# Patient Record
Sex: Female | Born: 1950 | Race: White | Hispanic: No | Smoking: Never smoker
Health system: Southern US, Community
[De-identification: ages and names within clinical notes are randomized; demographics above are authoritative.]

## PROBLEM LIST (undated history)

## (undated) DIAGNOSIS — D510 Vitamin B12 deficiency anemia due to intrinsic factor deficiency: Secondary | ICD-10-CM

## (undated) DIAGNOSIS — D509 Iron deficiency anemia, unspecified: Secondary | ICD-10-CM

## (undated) DIAGNOSIS — Z9289 Personal history of other medical treatment: Secondary | ICD-10-CM

## (undated) DIAGNOSIS — F4321 Adjustment disorder with depressed mood: Secondary | ICD-10-CM

## (undated) DIAGNOSIS — I10 Essential (primary) hypertension: Secondary | ICD-10-CM

## (undated) DIAGNOSIS — D61818 Other pancytopenia: Secondary | ICD-10-CM

## (undated) DIAGNOSIS — E039 Hypothyroidism, unspecified: Secondary | ICD-10-CM

## (undated) HISTORY — PX: ABDOMINAL HYSTERECTOMY: SHX81

## (undated) HISTORY — DX: Vitamin B12 deficiency anemia due to intrinsic factor deficiency: D51.0

## (undated) HISTORY — PX: EXPLORATORY LAPAROTOMY: SUR591

## (undated) HISTORY — DX: Hypothyroidism, unspecified: E03.9

---

## 2001-03-17 ENCOUNTER — Encounter: Payer: Self-pay | Admitting: Family Medicine

## 2001-03-17 ENCOUNTER — Encounter: Admission: RE | Admit: 2001-03-17 | Discharge: 2001-03-17 | Payer: Self-pay | Admitting: Family Medicine

## 2004-07-01 ENCOUNTER — Encounter: Admission: RE | Admit: 2004-07-01 | Discharge: 2004-07-01 | Payer: Self-pay | Admitting: Family Medicine

## 2005-05-11 ENCOUNTER — Emergency Department (HOSPITAL_COMMUNITY): Admission: EM | Admit: 2005-05-11 | Discharge: 2005-05-11 | Payer: Self-pay | Admitting: *Deleted

## 2006-02-25 ENCOUNTER — Encounter: Admission: RE | Admit: 2006-02-25 | Discharge: 2006-02-25 | Payer: Self-pay | Admitting: Family Medicine

## 2006-03-10 ENCOUNTER — Encounter: Admission: RE | Admit: 2006-03-10 | Discharge: 2006-03-10 | Payer: Self-pay | Admitting: Family Medicine

## 2006-11-14 ENCOUNTER — Emergency Department (HOSPITAL_COMMUNITY): Admission: EM | Admit: 2006-11-14 | Discharge: 2006-11-14 | Payer: Self-pay | Admitting: Emergency Medicine

## 2010-11-27 ENCOUNTER — Other Ambulatory Visit: Payer: Self-pay | Admitting: Family Medicine

## 2010-11-27 DIAGNOSIS — Z1231 Encounter for screening mammogram for malignant neoplasm of breast: Secondary | ICD-10-CM

## 2010-12-23 ENCOUNTER — Ambulatory Visit
Admission: RE | Admit: 2010-12-23 | Discharge: 2010-12-23 | Disposition: A | Discharge status: Home / Self Care | Payer: BC Managed Care – PPO | Source: Ambulatory Visit | Attending: Family Medicine | Admitting: Family Medicine

## 2010-12-23 DIAGNOSIS — Z1231 Encounter for screening mammogram for malignant neoplasm of breast: Secondary | ICD-10-CM

## 2012-06-24 ENCOUNTER — Other Ambulatory Visit: Payer: Self-pay | Admitting: Family Medicine

## 2012-06-24 DIAGNOSIS — Z1231 Encounter for screening mammogram for malignant neoplasm of breast: Secondary | ICD-10-CM

## 2012-06-30 ENCOUNTER — Ambulatory Visit
Admission: RE | Admit: 2012-06-30 | Discharge: 2012-06-30 | Disposition: A | Discharge status: Home / Self Care | Payer: Self-pay | Source: Ambulatory Visit | Attending: Family Medicine | Admitting: Family Medicine

## 2012-06-30 DIAGNOSIS — Z1231 Encounter for screening mammogram for malignant neoplasm of breast: Secondary | ICD-10-CM

## 2013-09-29 ENCOUNTER — Emergency Department (HOSPITAL_COMMUNITY): Payer: BC Managed Care – PPO

## 2013-09-29 ENCOUNTER — Emergency Department (HOSPITAL_COMMUNITY)
Admission: EM | Admit: 2013-09-29 | Discharge: 2013-09-29 | Disposition: A | Discharge status: Home / Self Care | Payer: BC Managed Care – PPO | Attending: Emergency Medicine | Admitting: Emergency Medicine

## 2013-09-29 ENCOUNTER — Encounter (HOSPITAL_COMMUNITY): Payer: Self-pay | Admitting: Emergency Medicine

## 2013-09-29 DIAGNOSIS — Z90710 Acquired absence of both cervix and uterus: Secondary | ICD-10-CM | POA: Insufficient documentation

## 2013-09-29 DIAGNOSIS — E876 Hypokalemia: Secondary | ICD-10-CM

## 2013-09-29 DIAGNOSIS — I10 Essential (primary) hypertension: Secondary | ICD-10-CM | POA: Insufficient documentation

## 2013-09-29 DIAGNOSIS — R630 Anorexia: Secondary | ICD-10-CM | POA: Insufficient documentation

## 2013-09-29 DIAGNOSIS — E079 Disorder of thyroid, unspecified: Secondary | ICD-10-CM | POA: Insufficient documentation

## 2013-09-29 DIAGNOSIS — Z79899 Other long term (current) drug therapy: Secondary | ICD-10-CM | POA: Insufficient documentation

## 2013-09-29 DIAGNOSIS — R109 Unspecified abdominal pain: Secondary | ICD-10-CM

## 2013-09-29 DIAGNOSIS — K8020 Calculus of gallbladder without cholecystitis without obstruction: Secondary | ICD-10-CM | POA: Insufficient documentation

## 2013-09-29 HISTORY — DX: Essential (primary) hypertension: I10

## 2013-09-29 LAB — COMPREHENSIVE METABOLIC PANEL
Albumin: 4.3 g/dL (ref 3.5–5.2)
Alkaline Phosphatase: 83 U/L (ref 39–117)
BUN: 10 mg/dL (ref 6–23)
Creatinine, Ser: 0.67 mg/dL (ref 0.50–1.10)
GFR calc Af Amer: >90 mL/min (ref >90)
Glucose, Bld: 120 mg/dL — ABNORMAL HIGH (ref 70–99)
Potassium: 2.8 mEq/L — ABNORMAL LOW (ref 3.5–5.1)
Total Protein: 8 g/dL (ref 6.0–8.3)

## 2013-09-29 LAB — CBC WITH DIFFERENTIAL/PLATELET
Basophils Absolute: 0 10*3/uL (ref 0.0–0.1)
Basophils Relative: 0 % (ref 0–1)
Eosinophils Absolute: 0 10*3/uL (ref 0.0–0.7)
Eosinophils Relative: 0 % (ref 0–5)
HCT: 38.4 % (ref 36.0–46.0)
Hemoglobin: 14.2 g/dL (ref 12.0–15.0)
MCH: 30.7 pg (ref 26.0–34.0)
MCHC: 37 g/dL — ABNORMAL HIGH (ref 30.0–36.0)
MCV: 83.1 fL (ref 78.0–100.0)
Monocytes Absolute: 0.5 10*3/uL (ref 0.1–1.0)
Monocytes Relative: 7 % (ref 3–12)
Neutro Abs: 6 10*3/uL (ref 1.7–7.7)
RDW: 12.8 % (ref 11.5–15.5)

## 2013-09-29 LAB — URINALYSIS, ROUTINE W REFLEX MICROSCOPIC
Glucose, UA: 100 mg/dL — AB
Ketones, ur: >80 mg/dL — AB
Leukocytes, UA: NEGATIVE
Nitrite: NEGATIVE
Protein, ur: NEGATIVE mg/dL
pH: 8 (ref 5.0–8.0)

## 2013-09-29 MED ORDER — SODIUM CHLORIDE 0.9 % IV BOLUS (SEPSIS)
500.0000 mL | Freq: Once | INTRAVENOUS | Status: AC
  Administered 2013-09-29: 500 mL via INTRAVENOUS

## 2013-09-29 MED ORDER — ONDANSETRON HCL 4 MG/2ML IJ SOLN
4.0000 mg | Freq: Once | INTRAMUSCULAR | Status: AC
  Administered 2013-09-29: 4 mg via INTRAVENOUS
  Filled 2013-09-29: qty 2

## 2013-09-29 MED ORDER — POTASSIUM CHLORIDE CRYS ER 20 MEQ PO TBCR
40.0000 meq | EXTENDED_RELEASE_TABLET | Freq: Once | ORAL | Status: AC
  Administered 2013-09-29: 40 meq via ORAL
  Filled 2013-09-29: qty 2

## 2013-09-29 MED ORDER — MORPHINE SULFATE 4 MG/ML IJ SOLN
4.0000 mg | Freq: Once | INTRAMUSCULAR | Status: AC
  Administered 2013-09-29: 4 mg via INTRAVENOUS
  Filled 2013-09-29: qty 1

## 2013-09-29 MED ORDER — ONDANSETRON 8 MG PO TBDP: 8.0000 mg | ORAL_TABLET | Freq: Once | ORAL | Status: DC

## 2013-09-29 MED ORDER — OXYCODONE-ACETAMINOPHEN 5-325 MG PO TABS: 1.0000 | ORAL_TABLET | Freq: Four times a day (QID) | ORAL | Status: DC | PRN

## 2013-09-29 MED ORDER — ONDANSETRON 4 MG PO TBDP: 4.0000 mg | ORAL_TABLET | Freq: Three times a day (TID) | ORAL | Status: DC | PRN

## 2013-09-29 NOTE — ED Notes (Signed)
Pt c/o n/v/d and abd pain that started earlier today. Pt vomited approx 3-4 times. Been to bathroom apporx 5 times with only 1 watery stool.

## 2013-09-29 NOTE — ED Provider Notes (Signed)
CSN: [id number]     Arrival date & time 09/29/13  1533 History   First MD Initiated Contact with Patient 09/29/13 1613     Chief Complaint  Patient presents with  . Abdominal Pain  . Emesis  . Diarrhea   (Consider location/radiation/quality/duration/timing/severity/associated sxs/prior Treatment) HPI Comments: Patient with h/o hysterectomy -- presents with acute onset upper abdominal pain at 11am with lower abdominal cramping, vomiting, soft/watery stools. She has had similar, less severe episodes x 5 over the past 6 weeks that resolve after several hours. She has residual anorexia afterwards. No fever, URI symptoms. No CP/SOB but pain can radiate into L breast at times. No urinary symptoms, LE edema, or skin swelling. Denies heavy NSAID use, denies alcohol use. The onset of this condition was acute. The course is constant. Aggravating factors: none. Alleviating factors: none.    The history is provided by the patient.    Past Medical History  Diagnosis Date  . Hypertension   . Thyroid disease    Past Surgical History  Procedure Laterality Date  . Abdominal hysterectomy     No family history on file. History  Substance Use Topics  . Smoking status: Never Smoker   . Smokeless tobacco: Never Used  . Alcohol Use: No   OB History   Grav Para Term Preterm Abortions TAB SAB Ect Mult Living                 Review of Systems  Constitutional: Negative for fever.  HENT: Negative for rhinorrhea and sore throat.   Eyes: Negative for redness.  Respiratory: Negative for cough.   Cardiovascular: Negative for chest pain.  Gastrointestinal: Positive for nausea, vomiting, abdominal pain and diarrhea. Negative for blood in stool.  Genitourinary: Negative for dysuria.  Musculoskeletal: Negative for myalgias.  Skin: Negative for rash.  Neurological: Negative for headaches.    Allergies  Review of patient's allergies indicates no known allergies.  Home Medications   Current  Outpatient Rx  Name  Route  Sig  Dispense  Refill  . cholecalciferol (VITAMIN D) 1000 UNITS tablet   Oral   Take 1,000 Units by mouth daily.         . hydrochlorothiazide (HYDRODIURIL) 25 MG tablet   Oral   Take 25 mg by mouth daily.         Marland Kitchen levothyroxine (SYNTHROID, LEVOTHROID) 125 MCG tablet   Oral   Take 125 mcg by mouth daily before breakfast.          BP 145/93  Pulse 59  Temp(Src) 98.4 F (36.9 C) (Oral)  Resp 20  SpO2 100% Physical Exam  Nursing note and vitals reviewed. Constitutional: She appears well-developed and well-nourished.  HENT:  Head: Normocephalic and atraumatic.  Eyes: Conjunctivae are normal. Right eye exhibits no discharge. Left eye exhibits no discharge.  Neck: Normal range of motion. Neck supple.  Cardiovascular: Normal rate, regular rhythm and normal heart sounds.   Pulmonary/Chest: Effort normal and breath sounds normal.  Abdominal: Soft. There is tenderness in the right upper quadrant, epigastric area, periumbilical area and left upper quadrant. There is no rebound, no guarding, no tenderness at McBurney's point and negative Murphy's sign.  Neurological: She is alert.  Skin: Skin is warm and dry.  Psychiatric: She has a normal mood and affect.    ED Course  Procedures (including critical care time) Labs Review Labs Reviewed  CBC WITH DIFFERENTIAL - Abnormal; Notable for the following:    MCHC 37.0 (*)  Neutrophils Relative % 78 (*)    All other components within normal limits  LIPASE, BLOOD - Abnormal; Notable for the following:    Lipase 70 (*)    All other components within normal limits  COMPREHENSIVE METABOLIC PANEL - Abnormal; Notable for the following:    Sodium 131 (*)    Potassium 2.8 (*)    Chloride 92 (*)    Glucose, Bld 120 (*)    All other components within normal limits  URINALYSIS, ROUTINE W REFLEX MICROSCOPIC   Imaging Review No results found.  EKG Interpretation    Date/Time:  Friday September 29 2013  18:42:19 EST Ventricular Rate:  59 PR Interval:  141 QRS Duration: 100 QT Interval:  462 QTC Calculation: 458 R Axis:   -31 Text Interpretation:  Sinus rhythm Left axis deviation No previous tracing Confirmed by POLLINA  MD, CHRISTOPHER ([id number]) on 09/29/2013 6:48:26 PM           4:28 PM Patient seen and examined. Work-up initiated. Medications ordered.   6:03 PM Pt updated. Imaging reviewed by myself. Will order [place] to eval gallbladder. Patient's pain controlled but slowly returning. She would like additional pain medication. Ordered.   Findings discussed with Dr. Blinda Leatherwood.   Pt informed. She is feeling much better. Pain is 1/10. We discussed cholelithiasis and no indications for admission now.   Will d/c to home with PCP/surgery follow-up as symptoms are well controlled.   The patient was urged to return to the Emergency Department immediately with worsening of current symptoms, worsening abdominal pain, persistent vomiting, blood noted in stools, fever, or any other concerns. The patient verbalized understanding.   Patient counseled on use of narcotic pain medications. Counseled not to combine these medications with others containing tylenol. Urged not to drink alcohol, drive, or perform any other activities that requires focus while taking these medications. The patient verbalizes understanding and agrees with the plan.  Patient notified of low potassium and asked to follow this with PCP and supplement potassium with OTC supplements. Patient verbalizes understanding and agrees with plan.     MDM   1. Abdominal pain   2. Cholelithiasis   3. Hypokalemia    Abdominal pain: likely upper GI etiology. Suspect cholelithiasis given findings today but PUD, esophageal spasm could be possible. Doubt pancreatitis. No signs of biliary obstruction. Will need PCP/surgery eval.   Cholelithiasis: No signs of cholecystitis  Low potassium/sodium/chloride: pt asymptomatic, likely 2/2 HCTZ  use. Pt informed. She will supplement and follow with PCP. Fluid bolus given in ED.    Renne Crigler, PA-C 09/29/13 2140

## 2013-09-29 NOTE — Progress Notes (Signed)
Patient confirms her pcp is Dr. Gerlene Fee.  System updated.

## 2013-09-29 NOTE — ED Notes (Signed)
Ultrasound at bedside

## 2013-09-30 NOTE — ED Provider Notes (Signed)
Medical screening examination/treatment/procedure(s) were performed by non-physician practitioner and as supervising physician I was immediately available for consultation/collaboration.  EKG Interpretation   None         Kebin Maye J. Lem Peary, MD 09/30/13 1514 

## 2013-10-17 ENCOUNTER — Encounter (INDEPENDENT_AMBULATORY_CARE_PROVIDER_SITE_OTHER): Payer: Self-pay | Admitting: General Surgery

## 2013-10-17 ENCOUNTER — Telehealth (INDEPENDENT_AMBULATORY_CARE_PROVIDER_SITE_OTHER): Payer: Self-pay | Admitting: General Surgery

## 2013-10-17 ENCOUNTER — Ambulatory Visit (INDEPENDENT_AMBULATORY_CARE_PROVIDER_SITE_OTHER): Payer: BC Managed Care – PPO | Admitting: General Surgery

## 2013-10-17 VITALS — BP 110/80 | HR 66 | Temp 97.0°F | Resp 12 | Ht 60.0 in | Wt 150.0 lb

## 2013-10-17 DIAGNOSIS — E876 Hypokalemia: Secondary | ICD-10-CM

## 2013-10-17 DIAGNOSIS — E079 Disorder of thyroid, unspecified: Secondary | ICD-10-CM

## 2013-10-17 DIAGNOSIS — I10 Essential (primary) hypertension: Secondary | ICD-10-CM | POA: Insufficient documentation

## 2013-10-17 DIAGNOSIS — K8020 Calculus of gallbladder without cholecystitis without obstruction: Secondary | ICD-10-CM

## 2013-10-17 NOTE — Telephone Encounter (Signed)
Patient met with surgery scheduling went over financial responsibility, patient will call back schedule

## 2013-10-17 NOTE — Patient Instructions (Signed)
The episodes of abdominal pain that you have been having over the past 6 weeks sounds very much like gallbladder attacks.  Your ultrasound confirmed that you have gallstones, and so it is almost certain that your gallbladder is causing this. This will continue , intermittently, until something is done.  To date, you have not had any serious complications.  You will be scheduled electively for laparoscopic cholecystectomy with cholangiogram, possible open.       Laparoscopic Cholecystectomy Laparoscopic cholecystectomy is surgery to remove the gallbladder. The gallbladder is located slightly to the right of center in the abdomen, behind the liver. It is a concentrating and storage sac for the bile produced in the liver. Bile aids in the digestion and absorption of fats. Gallbladder disease (cholecystitis) is an inflammation of your gallbladder. This condition is usually caused by a buildup of gallstones (cholelithiasis) in your gallbladder. Gallstones can block the flow of bile, resulting in inflammation and pain. In severe cases, emergency surgery may be required. When emergency surgery is not required, you will have time to prepare for the procedure. Laparoscopic surgery is an alternative to open surgery. Laparoscopic surgery usually has a shorter recovery time. Your common bile duct may also need to be examined and explored. Your caregiver will discuss this with you if he or she feels this should be done. If stones are found in the common bile duct, they may be removed. LET YOUR CAREGIVER KNOW ABOUT:  Allergies to food or medicine.  Medicines taken, including vitamins, herbs, eyedrops, over-the-counter medicines, and creams.  Use of steroids (by mouth or creams).  Previous problems with anesthetics or numbing medicines.  History of bleeding problems or blood clots.  Previous surgery.  Other health problems, including diabetes and kidney problems.  Possibility of pregnancy, if this  applies. RISKS AND COMPLICATIONS All surgery is associated with risks. Some problems that may occur following this procedure include:  Infection.  Damage to the common bile duct, nerves, arteries, veins, or other internal organs such as the stomach or intestines.  Bleeding.  A stone may remain in the common bile duct. BEFORE THE PROCEDURE  Do not take aspirin for 3 days prior to surgery or blood thinners for 1 week prior to surgery.  Do not eat or drink anything after midnight the night before surgery.  Let your caregiver know if you develop a cold or other infectious problem prior to surgery.  You should be present 60 minutes before the procedure or as directed. PROCEDURE  You will be given medicine that makes you sleep (general anesthetic). When you are asleep, your surgeon will make several small cuts (incisions) in your abdomen. One of these incisions is used to insert a small, lighted scope (laparoscope) into the abdomen. The laparoscope helps the surgeon see into your abdomen. Carbon dioxide gas will be pumped into your abdomen. The gas allows more room for the surgeon to perform your surgery. Other operating instruments are inserted through the other incisions. Laparoscopic procedures may not be appropriate when:  There is major scarring from previous surgery.  The gallbladder is extremely inflamed.  There are bleeding disorders or unexpected cirrhosis of the liver.  A pregnancy is near term.  Other conditions make the laparoscopic procedure impossible. If your surgeon feels it is not safe to continue with a laparoscopic procedure, he or she will perform an open abdominal procedure. In this case, the surgeon will make an incision to open the abdomen. This gives the surgeon a larger view and  field to work within. This may allow the surgeon to perform procedures that sometimes cannot be performed with a laparoscope alone. Open surgery has a longer recovery time. AFTER THE  PROCEDURE  You will be taken to the recovery area where a nurse will watch and check your progress.  You may be allowed to go home the same day.  Do not resume physical activities until directed by your caregiver.  You may resume a normal diet and activities as directed. Document Released: 10/05/2005 Document Revised: 12/28/2011 Document Reviewed: 05/17/2013 [facility] Patient Information 2014 [place], [place].

## 2013-10-17 NOTE — Telephone Encounter (Deleted)
Patient met with surgery scheduling went over financial responsibility, patient will call back schedule °

## 2013-10-17 NOTE — Progress Notes (Signed)
Patient ID: Alexis Ochoa, female   DOB: 1950-12-20, 62 y.o.   MRN: [id number]  Chief Complaint  Patient presents with  . Abdominal Pain    Gallstone    HPI Alexis Ochoa is a 62 y.o. female.  She is referred by Dr. Jaci Carrel at  the [facility] for evaluation and management of symptomatic gallstones. Dr. Jeanmarie Hubert is her PCP.   The patient is a 6 week history of intermittent attacks of mild upper abdominal discomfort that resolved after cervical hours. Anorexia last much longer period of fever cough. She's had a few episodes of more severe sharp upper abdominal pain, also with some lower abdominal cramping and occasional soft her stools. She had a severe attack on December 12. This was associated with vomiting. She went to [place] on where she department. The pain resolved after a few hours. Ultrasound showed a 1.9 cm gallstone. CBC was normal. Lipase 70. Potassium 2.8. Glucose 120. Normal urinalysis. She has been very careful about what she eats and has not had any severe problems since then.  Comorbidities include hypertension and hypothyroidism on prescription medication. She's had an abdominal hysterectomy but her ovaries are retained.  Diagnostic laparoscopy earlier in her life and was told she had endometriosis. This was done through an infraumbilical incision. Last colonoscopy 2012, normal, [facility]. She denies liver disease, cardiovascular disease, moderate disease.  History reveals that 2 sisters and her mother have had their gallbladders removed. Abdominal Pain Associated symptoms: nausea and vomiting   Associated symptoms: no chest pain, no chills, no constipation, no cough, no diarrhea, no fever, no hematuria, no sore throat and no vaginal bleeding     Past Medical History  Diagnosis Date  . Hypertension   . Thyroid disease     Past Surgical History  Procedure Laterality Date  . Abdominal hysterectomy    . Exploratory laparotomy      No family history on  file.  Social History History  Substance Use Topics  . Smoking status: Never Smoker   . Smokeless tobacco: Never Used  . Alcohol Use: No    No Known Allergies  Current Outpatient Prescriptions  Medication Sig Dispense Refill  . cholecalciferol (VITAMIN D) 1000 UNITS tablet Take 1,000 Units by mouth daily.      . hydrochlorothiazide (HYDRODIURIL) 25 MG tablet Take 25 mg by mouth daily.      Marland Kitchen levothyroxine (SYNTHROID, LEVOTHROID) 125 MCG tablet Take 125 mcg by mouth daily before breakfast.       No current facility-administered medications for this visit.    Review of Systems Review of Systems  Constitutional: Negative for fever, chills and unexpected weight change.  HENT: Negative for congestion, hearing loss, sore throat, trouble swallowing and voice change.   Eyes: Negative for visual disturbance.  Respiratory: Negative for cough and wheezing.   Cardiovascular: Negative for chest pain, palpitations and leg swelling.  Gastrointestinal: Positive for nausea, vomiting and abdominal pain. Negative for diarrhea, constipation, blood in stool, abdominal distention and anal bleeding.  Genitourinary: Negative for hematuria, vaginal bleeding and difficulty urinating.  Musculoskeletal: Negative for arthralgias.  Skin: Negative for rash and wound.  Neurological: Negative for seizures, syncope and headaches.  Hematological: Negative for adenopathy. Does not bruise/bleed easily.  Psychiatric/Behavioral: Negative for confusion.    Blood pressure 110/80, pulse 66, temperature 97 F (36.1 C), temperature source Oral, resp. rate 12, height 5' (1.524 m), weight 150 lb (68.04 kg).  Physical Exam Physical Exam  Constitutional: She is oriented  to person, place, and time. She appears well-developed and well-nourished. No distress.  HENT:  Head: Normocephalic and atraumatic.  Nose: Nose normal.  Mouth/Throat: No oropharyngeal exudate.  Eyes: Conjunctivae and EOM are normal. Pupils are equal,  round, and reactive to light. Left eye exhibits no discharge. No scleral icterus.  Neck: Neck supple. No JVD present. No tracheal deviation present. No thyromegaly present.  Cardiovascular: Normal rate, regular rhythm, normal heart sounds and intact distal pulses.   No murmur heard. Pulmonary/Chest: Effort normal and breath sounds normal. No respiratory distress. She has no wheezes. She has no rales. She exhibits no tenderness.  Abdominal: Soft. Bowel sounds are normal. She exhibits no distension and no mass. There is no tenderness. There is no rebound and no guarding.  Short vertical incision at the lower rim of the umbilicus, well healed. Pfannenstiel incision well healed. Examination otherwise benign.  Musculoskeletal: She exhibits no edema and no tenderness.  Lymphadenopathy:    She has no cervical adenopathy.  Neurological: She is alert and oriented to person, place, and time. She exhibits normal muscle tone. Coordination normal.  Skin: Skin is warm. No rash noted. She is not diaphoretic. No erythema. No pallor.  Psychiatric: She has a normal mood and affect. Her behavior is normal. Judgment and thought content normal.    Data Reviewed Emergency department records. Lab and x-ray.  Assessment    Chronic cholecystitis with cholelithiasis. Historically this sounds like she is having repeated episodes of severe biliary colic superimposed on a background of mild episodes of biliary colic. No complications today  Hypertension  Hypothyroidism  Status post abdominal hysterectomy with retained ovaries  Status post laparoscopy for endometriosis  Up-to-date on colonoscopy  Strong family history for cholecystectomy     Plan    We had a long discussion, and she was advised that her symptoms were likely to continue until she has a cholecystectomy. She is in favor of having elective cholecystectomy.  I discussed the indications, details, techniques, and numerous risk of cholecystectomy  with her and her husband. She is aware of the risk of bleeding, infection, incisional hernia, conversion to open laparotomy, bile leak with readmission for treatment, injury to adjacent organs such as intestine or bile duct with major reconstructive surgery, postop diarrhea, and other unforeseen problems. She understands all these issues well. This time all questions are answered. She is in full agreement with this plan.  We will schedule for laparoscopic cholecystectomy with cholangiogram, possible open in the near future her convenience.        Angelia Mould. Derrell Lolling, M.D., [facility] General and Minimally invasive Surgery Breast and Colorectal Surgery Office:   [phone] Pager:   [phone]  10/17/2013, 3:23 PM

## 2013-11-19 HISTORY — PX: LAPAROSCOPIC CHOLECYSTECTOMY: SUR755

## 2013-11-22 ENCOUNTER — Other Ambulatory Visit (INDEPENDENT_AMBULATORY_CARE_PROVIDER_SITE_OTHER): Payer: Self-pay | Admitting: General Surgery

## 2013-11-22 ENCOUNTER — Other Ambulatory Visit (INDEPENDENT_AMBULATORY_CARE_PROVIDER_SITE_OTHER): Payer: Self-pay | Admitting: *Deleted

## 2013-11-22 DIAGNOSIS — K8010 Calculus of gallbladder with chronic cholecystitis without obstruction: Secondary | ICD-10-CM

## 2013-11-22 MED ORDER — POTASSIUM CHLORIDE ER 10 MEQ PO TBCR
10.0000 meq | EXTENDED_RELEASE_TABLET | Freq: Every day | ORAL | Status: DC
Start: 2013-11-22 — End: 2018-05-03

## 2013-11-22 MED ORDER — HYDROCODONE-ACETAMINOPHEN 10-325 MG PO TABS: 1.0000 | ORAL_TABLET | ORAL | Status: DC | PRN

## 2013-11-22 MED ORDER — POTASSIUM CHLORIDE ER 10 MEQ PO TBCR: 10.0000 meq | EXTENDED_RELEASE_TABLET | Freq: Every day | ORAL | Status: DC

## 2013-11-22 MED ORDER — HYDROCODONE-ACETAMINOPHEN 5-325 MG PO TABS: 1.0000 | ORAL_TABLET | Freq: Four times a day (QID) | ORAL | Status: DC | PRN

## 2013-12-08 ENCOUNTER — Encounter (INDEPENDENT_AMBULATORY_CARE_PROVIDER_SITE_OTHER): Payer: BC Managed Care – PPO | Admitting: General Surgery

## 2013-12-22 ENCOUNTER — Ambulatory Visit (INDEPENDENT_AMBULATORY_CARE_PROVIDER_SITE_OTHER): Payer: BC Managed Care – PPO | Admitting: General Surgery

## 2013-12-22 ENCOUNTER — Encounter (INDEPENDENT_AMBULATORY_CARE_PROVIDER_SITE_OTHER): Payer: Self-pay | Admitting: General Surgery

## 2013-12-22 VITALS — BP 108/70 | HR 72 | Temp 97.9°F | Resp 16 | Ht 60.0 in | Wt 150.2 lb

## 2013-12-22 DIAGNOSIS — K8020 Calculus of gallbladder without cholecystitis without obstruction: Secondary | ICD-10-CM

## 2013-12-22 NOTE — Progress Notes (Signed)
Patient ID: Alexis Ochoa, female   DOB: 01-Feb-1951, 63 y.o.   MRN: [id number] History: This patient underwent laparoscopic cholecystectomy with cholangiogram on 11/22/2013. The gallbladder was thickwalled, chronically inflamed, required needle aspiration to decompress before we could manipulate it. The cholangiogram was normal. Final pathology report shows chronic cholecystitis with cholelithiasis. She has done very well. She has no complaints. All of her chest tightness and abdominal pain have resolved.Tolerating regular diet. No nausea. No diarrhea. No wound problems  Exam: Patient looks well Abdomen soft and nontender. All trocar sites healing uneventfully  Assessment: Chronic cholecystitis with cholelithiasis, uneventful recovery following laparoscopic cholecystectomy with cholangiogram  Plan: Resume normal activities without restriction Lymphadenitis return to see me as needed. Patient was given copy of pathology report.    Edsel Petrin. Dalbert Batman, M.D., [facility] General and Minimally invasive Surgery Breast and Colorectal Surgery Office:   [phone] Pager:   [phone]

## 2013-12-22 NOTE — Patient Instructions (Signed)
You have recovered from your gallbladder surgery without any obvious complications.  You may resume normal physical activities without restriction.  A low-fat diet is advised.  Return to see Dr. Dalbert Batman as necessary.

## 2017-04-28 ENCOUNTER — Observation Stay (HOSPITAL_COMMUNITY): Payer: PPO

## 2017-04-28 ENCOUNTER — Encounter (HOSPITAL_COMMUNITY): Payer: Self-pay | Admitting: Emergency Medicine

## 2017-04-28 ENCOUNTER — Observation Stay (HOSPITAL_COMMUNITY)
Admission: EM | Admit: 2017-04-28 | Discharge: 2017-04-29 | Disposition: A | Discharge status: Home / Self Care | Payer: PPO | Attending: Internal Medicine | Admitting: Internal Medicine

## 2017-04-28 DIAGNOSIS — E039 Hypothyroidism, unspecified: Secondary | ICD-10-CM | POA: Diagnosis not present

## 2017-04-28 DIAGNOSIS — Z9049 Acquired absence of other specified parts of digestive tract: Secondary | ICD-10-CM | POA: Diagnosis not present

## 2017-04-28 DIAGNOSIS — I10 Essential (primary) hypertension: Secondary | ICD-10-CM | POA: Diagnosis present

## 2017-04-28 DIAGNOSIS — Z79899 Other long term (current) drug therapy: Secondary | ICD-10-CM | POA: Insufficient documentation

## 2017-04-28 DIAGNOSIS — R634 Abnormal weight loss: Secondary | ICD-10-CM | POA: Diagnosis not present

## 2017-04-28 DIAGNOSIS — Z90710 Acquired absence of both cervix and uterus: Secondary | ICD-10-CM | POA: Diagnosis not present

## 2017-04-28 DIAGNOSIS — I700 Atherosclerosis of aorta: Secondary | ICD-10-CM | POA: Diagnosis not present

## 2017-04-28 DIAGNOSIS — E079 Disorder of thyroid, unspecified: Secondary | ICD-10-CM

## 2017-04-28 DIAGNOSIS — Z800 Family history of malignant neoplasm of digestive organs: Secondary | ICD-10-CM | POA: Insufficient documentation

## 2017-04-28 DIAGNOSIS — R161 Splenomegaly, not elsewhere classified: Secondary | ICD-10-CM | POA: Diagnosis not present

## 2017-04-28 DIAGNOSIS — D649 Anemia, unspecified: Secondary | ICD-10-CM

## 2017-04-28 DIAGNOSIS — D61818 Other pancytopenia: Secondary | ICD-10-CM | POA: Diagnosis present

## 2017-04-28 DIAGNOSIS — D509 Iron deficiency anemia, unspecified: Secondary | ICD-10-CM

## 2017-04-28 DIAGNOSIS — Z7982 Long term (current) use of aspirin: Secondary | ICD-10-CM | POA: Insufficient documentation

## 2017-04-28 DIAGNOSIS — E538 Deficiency of other specified B group vitamins: Secondary | ICD-10-CM | POA: Diagnosis not present

## 2017-04-28 DIAGNOSIS — D519 Vitamin B12 deficiency anemia, unspecified: Secondary | ICD-10-CM

## 2017-04-28 HISTORY — DX: Other pancytopenia: D61.818

## 2017-04-28 HISTORY — DX: Adjustment disorder with depressed mood: F43.21

## 2017-04-28 HISTORY — DX: Personal history of other medical treatment: Z92.89

## 2017-04-28 HISTORY — DX: Iron deficiency anemia, unspecified: D50.9

## 2017-04-28 LAB — URINALYSIS, ROUTINE W REFLEX MICROSCOPIC
BILIRUBIN URINE: NEGATIVE
Glucose, UA: NEGATIVE mg/dL
Hgb urine dipstick: NEGATIVE
Ketones, ur: NEGATIVE mg/dL
Leukocytes, UA: NEGATIVE
Nitrite: NEGATIVE
PH: 5 (ref 5.0–8.0)
Protein, ur: NEGATIVE mg/dL
SPECIFIC GRAVITY, URINE: 1.009 (ref 1.005–1.030)

## 2017-04-28 LAB — IRON AND TIBC
Iron: 115 ug/dL (ref 28–170)
Saturation Ratios: 45 % — ABNORMAL HIGH (ref 10.4–31.8)
TIBC: 256 ug/dL (ref 250–450)
UIBC: 141 ug/dL

## 2017-04-28 LAB — CBC
HEMATOCRIT: 18.7 % — AB (ref 36.0–46.0)
HEMOGLOBIN: 6.9 g/dL — AB (ref 12.0–15.0)
MCH: 40.2 pg — AB (ref 26.0–34.0)
MCHC: 36.4 g/dL — ABNORMAL HIGH (ref 30.0–36.0)
MCV: 110.7 fL — AB (ref 78.0–100.0)
Platelets: 125 10*3/uL — ABNORMAL LOW (ref 150–400)
RBC: 1.69 MIL/uL — AB (ref 3.87–5.11)
RDW: 18.8 % — ABNORMAL HIGH (ref 11.5–15.5)
WBC: 2.3 10*3/uL — ABNORMAL LOW (ref 4.0–10.5)

## 2017-04-28 LAB — DIFFERENTIAL
BASOS PCT: 0 %
Basophils Absolute: 0 10*3/uL (ref 0.0–0.1)
EOS PCT: 1 %
Eosinophils Absolute: 0 10*3/uL (ref 0.0–0.7)
Lymphocytes Relative: 34 %
Lymphs Abs: 0.8 10*3/uL (ref 0.7–4.0)
MONO ABS: 0 10*3/uL — AB (ref 0.1–1.0)
Monocytes Relative: 2 %
NEUTROS PCT: 63 %
Neutro Abs: 1.5 10*3/uL — ABNORMAL LOW (ref 1.7–7.7)

## 2017-04-28 LAB — BASIC METABOLIC PANEL
ANION GAP: 4 — AB (ref 5–15)
BUN: 8 mg/dL (ref 6–20)
CHLORIDE: 109 mmol/L (ref 101–111)
CO2: 24 mmol/L (ref 22–32)
Calcium: 8.5 mg/dL — ABNORMAL LOW (ref 8.9–10.3)
Creatinine, Ser: 0.5 mg/dL (ref 0.44–1.00)
GFR calc Af Amer: >60 mL/min (ref >60)
GFR calc non Af Amer: >60 mL/min (ref >60)
GLUCOSE: 106 mg/dL — AB (ref 65–99)
POTASSIUM: 3.6 mmol/L (ref 3.5–5.1)
Sodium: 137 mmol/L (ref 135–145)

## 2017-04-28 LAB — RETICULOCYTES
RBC.: 1.73 MIL/uL — AB (ref 3.87–5.11)
RETIC COUNT ABSOLUTE: 17.3 10*3/uL — AB (ref 19.0–186.0)
Retic Ct Pct: 1 % (ref 0.4–3.1)

## 2017-04-28 LAB — FERRITIN: FERRITIN: 157 ng/mL (ref 11–307)

## 2017-04-28 LAB — CBG MONITORING, ED: GLUCOSE-CAPILLARY: 77 mg/dL (ref 65–99)

## 2017-04-28 LAB — ABO/RH: ABO/RH(D): A POS

## 2017-04-28 LAB — OCCULT BLOOD X 1 CARD TO LAB, STOOL: Fecal Occult Bld: NEGATIVE

## 2017-04-28 LAB — VITAMIN B12: Vitamin B-12: <50 pg/mL — ABNORMAL LOW (ref 180–914)

## 2017-04-28 LAB — PREPARE RBC (CROSSMATCH)

## 2017-04-28 LAB — FOLATE: FOLATE: 33 ng/mL (ref >5.9)

## 2017-04-28 MED ORDER — SODIUM CHLORIDE 0.9 % IV SOLN
10.0000 mL/h | Freq: Once | INTRAVENOUS | Status: AC
  Administered 2017-04-28: 10 mL/h via INTRAVENOUS

## 2017-04-28 MED ORDER — LEVOTHYROXINE SODIUM 112 MCG PO TABS
112.0000 ug | ORAL_TABLET | Freq: Every day | ORAL | Status: DC
  Administered 2017-04-28: 112 ug via ORAL
  Filled 2017-04-28: qty 1

## 2017-04-28 MED ORDER — CYANOCOBALAMIN 1000 MCG/ML IJ SOLN
1000.0000 ug | Freq: Once | INTRAMUSCULAR | Status: AC
  Administered 2017-04-29: 1000 ug via INTRAMUSCULAR
  Filled 2017-04-28 (×2): qty 1

## 2017-04-28 MED ORDER — ASPIRIN EC 81 MG PO TBEC
81.0000 mg | DELAYED_RELEASE_TABLET | Freq: Every day | ORAL | Status: DC
  Administered 2017-04-29: 81 mg via ORAL
  Filled 2017-04-28: qty 1

## 2017-04-28 MED ORDER — IOPAMIDOL (ISOVUE-300) INJECTION 61%
INTRAVENOUS | Status: AC
  Administered 2017-04-28: 100 mL
  Filled 2017-04-28: qty 50

## 2017-04-28 MED ORDER — ENSURE ENLIVE PO LIQD
237.0000 mL | Freq: Two times a day (BID) | ORAL | Status: DC
  Administered 2017-04-29: 237 mL via ORAL

## 2017-04-28 MED ORDER — IOPAMIDOL (ISOVUE-300) INJECTION 61%
INTRAVENOUS | Status: AC
  Filled 2017-04-28: qty 100

## 2017-04-28 NOTE — ED Notes (Signed)
Attempted report x 2 

## 2017-04-28 NOTE — ED Triage Notes (Signed)
Pt. Stated, I went to give blood on Monday and they would not let me give due to my hemoglobin being too low. My doctor said to come here. Sometimes I feel light headed.

## 2017-04-28 NOTE — ED Provider Notes (Addendum)
Medical screening examination/treatment/procedure(s) were conducted as a shared visit with non-physician practitioner(s) and myself.  I personally evaluated the patient during the encounter.   EKG Interpretation None      66 year old female who presents with anemia. She has a history of hypertension and hypothyroidism. Attempted to donate blood about a week ago, and was refused due to low hemoglobin. Had routine blood work that was performed that showed a hemoglobin of 7.9 through her PCPs office a week ago. Over the past 3 days has had increased fatigue, and sent to ED for evaluation. No recent illnesses including fevers, cough, vomiting, diarrhea, melena or hematochezia. Does endorse a 40 pound weight loss over the past year that been unintentional, with decreased appetite, stating nothing tastes the same as before, and having night sweats as well. She appears pale, with conjunctival pallor. Vital signs are stable. She has a hemoglobin of 6.9, and we will transfuse. She has pancytopenia with decreased the WBCs and platelets as well. Differential is pending at this time. Admit for transfusion and further work-up.   CRITICAL CARE Performed by: Forde Dandy   Total critical care time: 32 minutes Critical care time was exclusive of separately billable procedures and treating other patients.  Critical care was necessary to treat or prevent imminent or life-threatening deterioration.  Critical care was time spent personally by me on the following activities: development of treatment plan with patient and/or surrogate as well as nursing, discussions with consultants, evaluation of patient's response to treatment, examination of patient, obtaining history from patient or surrogate, ordering and performing treatments and interventions, ordering and review of laboratory studies, ordering and review of radiographic studies, pulse oximetry and re-evaluation of patient's condition.    Forde Dandy,  MD 04/28/17 1609    Forde Dandy, MD 04/28/17 [phone]

## 2017-04-28 NOTE — ED Provider Notes (Addendum)
[facility] DEPT Provider Note   CSN: [id number] Arrival date & time: 04/28/17  1334     History   Chief Complaint Chief Complaint  Patient presents with  . Labs Only    low hgb per doctor  . Near Syncope    HPI Alexis Ochoa is a 66 y.o. female with history of hypertension, hypothyroidism who presents from her primary care provider's office with low hemoglobin level. Patient reports she tried to donate blood 2 days ago and could not due to low hemoglobin. Patient went to her PCP today and had a level of 7.9. She was sent here for evaluation and treatment. Patient reports she has felt a little lightheaded lately, however denies any other symptoms. She denies any chest pain, shortness of breath. Denies any bloody stools, abdominal pain, nausea, vomiting, urinary symptoms. Patient does note 2 episodes of room spinning vertigo in the past 6 months which lasted less than a day, but no other abnormalities in her state of health lately. The last episode of vertigo was one month ago when she was working on her sink changing a pipe.Patient reports having a colonoscopy 5 years ago and was given 10 years and she had a repeat. She does note that she has had an unintentional 40 pound weight loss in the past year, decreased appetite, and some night sweats.  HPI  Past Medical History:  Diagnosis Date  . Hypertension   . Thyroid disease     Patient Active Problem List   Diagnosis Date Noted  . Pancytopenia ([place]) 04/28/2017  . Weight loss 04/28/2017  . Gallstones 10/17/2013  . Essential hypertension, benign 10/17/2013  . Hypokalemia 10/17/2013  . Thyroid disease 10/17/2013    Past Surgical History:  Procedure Laterality Date  . ABDOMINAL HYSTERECTOMY    . CHOLECYSTECTOMY    . EXPLORATORY LAPAROTOMY      OB History    No data available       Home Medications    Prior to Admission medications   Medication Sig Start Date End Date Taking? Authorizing Provider  aspirin EC 81 MG  tablet Take 81 mg by mouth daily.   Yes [provider]  cholecalciferol (VITAMIN D) 1000 UNITS tablet Take 1,000 Units by mouth at bedtime.    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   Yes [provider]  levothyroxine (SYNTHROID, LEVOTHROID) 112 MCG tablet Take 112 mcg by mouth at bedtime.    Yes [provider]  potassium chloride (K-DUR) 10 MEQ tablet Take 1 tablet (10 mEq total) by mouth daily. Given at discharge from [facility] Patient not taking: Reported on 04/28/2017 11/22/13   Fanny Skates, MD    Family History No family history on file.  Social History Social History  Substance Use Topics  . Smoking status: Never Smoker  . Smokeless tobacco: Never Used  . Alcohol use No     Allergies   Patient has no known allergies.   Review of Systems Review of Systems  Constitutional: Negative for chills and fever.  HENT: Negative for facial swelling and sore throat.   Respiratory: Negative for shortness of breath.   Cardiovascular: Negative for chest pain.  Gastrointestinal: Negative for abdominal pain, nausea and vomiting.  Genitourinary: Negative for dysuria.  Musculoskeletal: Negative for back pain.  Skin: Negative for rash and wound.  Neurological: Positive for light-headedness. Negative for headaches.  Psychiatric/Behavioral: The patient is not nervous/anxious.  Physical Exam Updated Vital Signs BP 127/61   Pulse 67   Temp 98.1 F (36.7 C) (Oral)   Resp 16   Ht 5\' 1"  (1.549 m)   Wt 55.3 kg (122 lb)   SpO2 97%   BMI 23.05 kg/m   Physical Exam  Constitutional: She appears well-developed and well-nourished. No distress.  HENT:  Head: Normocephalic and atraumatic.  Mouth/Throat: Oropharynx is clear and moist. No oropharyngeal exudate.  Floor of mouth very pale  Eyes: Conjunctivae are normal. Pupils are equal, round, and reactive to light. Right eye  exhibits no discharge. Left eye exhibits no discharge. No scleral icterus.  Pale conjunctiva  Neck: Normal range of motion. Neck supple. No thyromegaly present.  Cardiovascular: Normal rate, regular rhythm, normal heart sounds and intact distal pulses.  Exam reveals no gallop and no friction rub.   No murmur heard. Pulmonary/Chest: Effort normal and breath sounds normal. No stridor. No respiratory distress. She has no wheezes. She has no rales.  Abdominal: Soft. Bowel sounds are normal. She exhibits no distension. There is no tenderness. There is no rebound and no guarding.  Musculoskeletal: She exhibits no edema.  Lymphadenopathy:    She has no cervical adenopathy.  Neurological: She is alert. Coordination normal.  Skin: Skin is warm and dry. No rash noted. She is not diaphoretic. There is pallor.  Psychiatric: She has a normal mood and affect.  Nursing note and vitals reviewed.    ED Treatments / Results  Labs (all labs ordered are listed, but only abnormal results are displayed) Labs Reviewed  BASIC METABOLIC PANEL - Abnormal; Notable for the following:       Result Value   Glucose, Bld 106 (*)    Calcium 8.5 (*)    Anion gap 4 (*)    All other components within normal limits  CBC - Abnormal; Notable for the following:    WBC 2.3 (*)    RBC 1.69 (*)    Hemoglobin 6.9 (*)    HCT 18.7 (*)    MCV 110.7 (*)    MCH 40.2 (*)    MCHC 36.4 (*)    RDW 18.8 (*)    Platelets 125 (*)    All other components within normal limits  RETICULOCYTES - Abnormal; Notable for the following:    RBC. 1.73 (*)    Retic Count, Absolute 17.3 (*)    All other components within normal limits  DIFFERENTIAL - Abnormal; Notable for the following:    Neutro Abs 1.5 (*)    Monocytes Absolute 0.0 (*)    All other components within normal limits  URINALYSIS, ROUTINE W REFLEX MICROSCOPIC  OCCULT BLOOD X 1 CARD TO LAB, STOOL  VITAMIN B12  FOLATE  IRON AND TIBC  FERRITIN  CBG MONITORING, ED  TYPE  AND SCREEN  PREPARE RBC (CROSSMATCH)  ABO/RH    EKG  EKG Interpretation None       Radiology No results found.  Procedures Procedures (including critical care time)  CRITICAL CARE Performed by: Frederica Kuster   Total critical care time: 30 minutes  Critical care time was exclusive of separately billable procedures and treating other patients.  Critical care was necessary to treat or prevent imminent or life-threatening deterioration.  Critical care was time spent personally by me on the following activities: development of treatment plan with patient and/or surrogate as well as nursing, discussions with consultants, evaluation of patient's response to treatment, examination of patient, obtaining history from patient or surrogate,  ordering and performing treatments and interventions, ordering and review of laboratory studies, ordering and review of radiographic studies, pulse oximetry and re-evaluation of patient's condition.   Medications Ordered in ED Medications  0.9 %  sodium chloride infusion (not administered)     Initial Impression / Assessment and Plan / ED Course  I have reviewed the triage vital signs and the nursing notes.  Pertinent labs & imaging results that were available during my care of the patient were reviewed by me and considered in my medical decision making (see chart for details).     Patient with pancytopenia. WBC 2.3, hemoglobin 6.9, hematocrit 18.7, platelet 120 5K. BMP shows glucose 106, calcium 8.5, anion gap 4. UA negative. Fecal occult negative. Anemia panel pending. 2 units of packed RBCs given in the ED, consent given after discussion of risks. I consulted with [facility], Dr. Cruzita Lederer, who will admit the patient for further evaluation and treatment. Patient understands and agrees with plan. Patient also evaluated by Dr. Oleta Mouse, who guided the patient's management and agrees with plan.  Final Clinical Impressions(s) / ED Diagnoses    Final diagnoses:  Pancytopenia ([place])  Anemia, unspecified type    New Prescriptions New Prescriptions   No medications on file     Caryl Ada 04/28/17 1718    Forde Dandy, MD 04/28/17 Virl Cagey    Frederica Kuster, PA-C 04/28/17 1853    Forde Dandy, MD 04/28/17 2340

## 2017-04-28 NOTE — Progress Notes (Signed)
Pt just arrived onto unit in rm 6. A& O x 4. No c/o pain. I unit/2 of PBRCS currently infusing. Pt instructed to call for help when needed and verbalizes understanding. Will pass report onto night nurse.    Skin assessment done by Carolynne Edouard RN, and Nadene Rubins, RN.

## 2017-04-28 NOTE — H&P (Signed)
History and Physical    Parrie Rasco VPX:[id number] DOB: Jul 26, 1951 DOA: 04/28/2017  I have briefly reviewed the patient's prior medical records in [place]  PCP: Gaynelle Arabian, MD  Patient coming from: home  Chief Complaint: weakness, fatigue, weight loss  HPI: Alexis Ochoa is a 66 y.o. female with medical history significant of hypertension, hypothyroidism, prior cholecystectomy, presents to the emergency room with chief complaint of weakness and fatigue, and was also sent by her PCP due to low hemoglobin.  Patient tells me that she has been trying to donate blood last week, and was refused due to having a low hemoglobin, and was directed toward PCP.  She saw her PCP and her hemoglobin was decreased in the 7 range, and was related to the emergency room for further evaluation and treatment.  She complains of feeling lightheaded a little bit weak in the last 1-2 weeks, however has no other symptoms.  She denies any chest pain, she denies any shortness of breath.  She has no abdominal pain, no nausea vomiting or diarrhea.  She occasionally has loose stools but nothing out of the ordinary.  She reports 1 left upper quadrant abdominal cramping she is here yesterday, now resolved.  She denies any frank fever or chills, however complains of night sweats on and off for the past 1-2 months.  She is also reporting for 40 pound weight loss in the last 4-6 months.  She is not smoking, however has a strong history of secondhand exposure to cigarette smoke as her husband is a heavy smoker.  She denies alcohol abuse.  ED Course: In the emergency room patient is afebrile, normotensive, satting well on room air.  Blood work is remarkable for normal kidney function, anemia panel reveals normal iron and ferritin however vitamin B12 is undetectable.  She is pancytopenic with a white count of 2.3, hemoglobin of 6.9 and platelets of 125.  TRH was asked for admission for pancytopenia of unclear  etiology  Review of Systems: As per HPI otherwise 10 point review of systems negative.   Past Medical History:  Diagnosis Date  . Hypertension   . Thyroid disease     Past Surgical History:  Procedure Laterality Date  . ABDOMINAL HYSTERECTOMY    . CHOLECYSTECTOMY    . EXPLORATORY LAPAROTOMY       reports that she has never smoked. She has never used smokeless tobacco. She reports that she does not drink alcohol or use drugs.  No Known Allergies  Family history positive for colon cancer in her brother  Prior to Admission medications   Medication Sig Start Date End Date Taking? Authorizing Provider  aspirin EC 81 MG tablet Take 81 mg by mouth daily.   Yes [provider]  cholecalciferol (VITAMIN D) 1000 UNITS tablet Take 1,000 Units by mouth at bedtime.    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   Yes [provider]  levothyroxine (SYNTHROID, LEVOTHROID) 112 MCG tablet Take 112 mcg by mouth at bedtime.    Yes [provider]  potassium chloride (K-DUR) 10 MEQ tablet Take 1 tablet (10 mEq total) by mouth daily. Given at discharge from [facility] Patient not taking: Reported on 04/28/2017 11/22/13   Fanny Skates, MD    Physical Exam: Vitals:   04/28/17 1355 04/28/17 1600 04/28/17 1615 04/28/17 1645  BP:  (!) 112/93 (!) 130/58 127/61  Pulse:  61  67  Resp:  Temp:      TempSrc:      SpO2:  99% 91% 97%  Weight: 55.3 kg (122 lb)     Height: 5\' 1"  (1.549 m)       Constitutional: NAD, calm, comfortable Eyes: PERRL, lids and conjunctivae pale ENMT: Mucous membranes are moist. Posterior pharynx clear of any exudate or lesions. ? Supraclavicular LAD Neck: normal, supple, no masses Respiratory: clear to auscultation bilaterally, no wheezing, no crackles.  Cardiovascular: Regular rate and rhythm, no murmurs / rubs / gallops. No extremity edema.  Abdomen: no  tenderness, no masses palpated. Bowel sounds positive.  Musculoskeletal: no clubbing / cyanosis. Normal muscle tone.  Skin: no rashes, lesions, ulcers. No induration Neurologic: CN 2-12 grossly intact. Strength 5/5 in all 4.  Psychiatric: Normal judgment and insight. Alert and oriented x 3. Normal mood.   Labs on Admission: I have personally reviewed following labs and imaging studies  CBC:  Recent Labs Lab 04/28/17 1405 04/28/17 1549  WBC 2.3*  --   NEUTROABS  --  1.5*  HGB 6.9*  --   HCT 18.7*  --   MCV 110.7*  --   PLT 125*  --    Basic Metabolic Panel:  Recent Labs Lab 04/28/17 1405  NA 137  K 3.6  CL 109  CO2 24  GLUCOSE 106*  BUN 8  CREATININE 0.50  CALCIUM 8.5*   GFR: Estimated Creatinine Clearance: 52.9 mL/min (by C-G formula based on SCr of 0.5 mg/dL). Liver Function Tests: No results for input(s): AST, ALT, ALKPHOS, BILITOT, PROT, ALBUMIN in the last 168 hours. No results for input(s): LIPASE, AMYLASE in the last 168 hours. No results for input(s): AMMONIA in the last 168 hours. Coagulation Profile: No results for input(s): INR, PROTIME in the last 168 hours. Cardiac Enzymes: No results for input(s): CKTOTAL, CKMB, CKMBINDEX, TROPONINI in the last 168 hours. BNP (last 3 results) No results for input(s): PROBNP in the last 8760 hours. HbA1C: No results for input(s): HGBA1C in the last 72 hours. CBG: No results for input(s): GLUCAP in the last 168 hours. Lipid Profile: No results for input(s): CHOL, HDL, LDLCALC, TRIG, CHOLHDL, LDLDIRECT in the last 72 hours. Thyroid Function Tests: No results for input(s): TSH, T4TOTAL, FREET4, T3FREE, THYROIDAB in the last 72 hours. Anemia Panel:  Recent Labs  04/28/17 1549  RETICCTPCT 1.0   Urine analysis:    Component Value Date/Time   COLORURINE YELLOW 04/28/2017 1426   APPEARANCEUR CLEAR 04/28/2017 1426   LABSPEC 1.009 04/28/2017 1426   PHURINE 5.0 04/28/2017 1426   GLUCOSEU NEGATIVE 04/28/2017 1426    HGBUR NEGATIVE 04/28/2017 1426   BILIRUBINUR NEGATIVE 04/28/2017 1426   KETONESUR NEGATIVE 04/28/2017 1426   PROTEINUR NEGATIVE 04/28/2017 1426   UROBILINOGEN 0.2 09/29/2013 1807   NITRITE NEGATIVE 04/28/2017 1426   LEUKOCYTESUR NEGATIVE 04/28/2017 1426     Radiological Exams on Admission: No results found.  Assessment/Plan Active Problems:   Essential hypertension, benign   Thyroid disease   Pancytopenia ([facility])   Weight loss   Pancytopenia -Patient with what appears to be an acute on chronic component of anemia, hemoglobin 10 she may have been low for a number of weeks.  She is microcytic suggesting B12 deficiency, and anemia panel does confirm profound B12 deficiency which is likely explanation for her symptoms. We will provide B12 IM -She will be transfused 2 units of packed red blood cells as ordered by ED physician -Clinically reports no bleeding/melena, and her fecal occult testing was  negative.  Of note, she had colonoscopy about 5 years ago which was negative  Weight loss/B type symptoms -Concerning for underlying malignancy given 40 pound weight loss over the last few months, unintentional.  She has been having night sweats for a few weeks now, has family history of malignancy, low probability but will need to rule out any underlying malignancy, will obtain a CT scan of the chest abdomen pelvis  Hypothyroidism -Continue home Synthroid, check a TSH   DVT prophylaxis: SCDs Code Status: Full code Family Communication: Discussed with daughter at bedside Disposition Plan: Admit to [facility], expect home when ready Consults called: None    Admission status: Observation  At the point of initial evaluation, it is my clinical opinion that admission for OBSERVATION is reasonable and necessary because the patient's presenting complaints in the context of their chronic conditions represent sufficient risk of deterioration or significant morbidity to constitute reasonable  grounds for close observation in the hospital setting, but that the patient may be medically stable for discharge from the hospital within 24 to 48 hours.    Marzetta Board, MD [facility] Pager [phone]  If 7PM-7AM, please contact night-coverage [email or web address] Password [email or web address]  04/28/2017, 5:18 PM

## 2017-04-28 NOTE — ED Notes (Signed)
Attempted report x1. 

## 2017-04-28 NOTE — ED Notes (Signed)
2 units ready 

## 2017-04-29 DIAGNOSIS — I10 Essential (primary) hypertension: Secondary | ICD-10-CM | POA: Diagnosis not present

## 2017-04-29 DIAGNOSIS — E079 Disorder of thyroid, unspecified: Secondary | ICD-10-CM | POA: Diagnosis not present

## 2017-04-29 DIAGNOSIS — R634 Abnormal weight loss: Secondary | ICD-10-CM | POA: Diagnosis not present

## 2017-04-29 DIAGNOSIS — D519 Vitamin B12 deficiency anemia, unspecified: Secondary | ICD-10-CM | POA: Diagnosis not present

## 2017-04-29 DIAGNOSIS — E538 Deficiency of other specified B group vitamins: Secondary | ICD-10-CM | POA: Diagnosis not present

## 2017-04-29 DIAGNOSIS — D61818 Other pancytopenia: Secondary | ICD-10-CM | POA: Diagnosis not present

## 2017-04-29 LAB — BPAM RBC
Blood Product Expiration Date: 201807182359
Blood Product Expiration Date: 201807242359
ISSUE DATE / TIME: 201807111710
ISSUE DATE / TIME: 201807112002
Unit Type and Rh: 6200
Unit Type and Rh: 6200

## 2017-04-29 LAB — TYPE AND SCREEN
ABO/RH(D): A POS
Antibody Screen: NEGATIVE
Unit division: 0
Unit division: 0

## 2017-04-29 LAB — COMPREHENSIVE METABOLIC PANEL
ALT: 26 U/L (ref 14–54)
ANION GAP: 4 — AB (ref 5–15)
AST: 29 U/L (ref 15–41)
Albumin: 3.4 g/dL — ABNORMAL LOW (ref 3.5–5.0)
Alkaline Phosphatase: 57 U/L (ref 38–126)
BUN: 8 mg/dL (ref 6–20)
CHLORIDE: 109 mmol/L (ref 101–111)
CO2: 26 mmol/L (ref 22–32)
Calcium: 8.4 mg/dL — ABNORMAL LOW (ref 8.9–10.3)
Creatinine, Ser: 0.58 mg/dL (ref 0.44–1.00)
Glucose, Bld: 90 mg/dL (ref 65–99)
POTASSIUM: 3.7 mmol/L (ref 3.5–5.1)
Sodium: 139 mmol/L (ref 135–145)
Total Bilirubin: 3.2 mg/dL — ABNORMAL HIGH (ref 0.3–1.2)
Total Protein: 5.2 g/dL — ABNORMAL LOW (ref 6.5–8.1)

## 2017-04-29 LAB — CBC WITH DIFFERENTIAL/PLATELET
Basophils Absolute: 0 10*3/uL (ref 0.0–0.1)
Basophils Relative: 0 %
EOS PCT: 3 %
Eosinophils Absolute: 0.1 10*3/uL (ref 0.0–0.7)
HEMATOCRIT: 26.9 % — AB (ref 36.0–46.0)
Hemoglobin: 9.4 g/dL — ABNORMAL LOW (ref 12.0–15.0)
Lymphocytes Relative: 40 %
Lymphs Abs: 1.3 10*3/uL (ref 0.7–4.0)
MCH: 34.7 pg — ABNORMAL HIGH (ref 26.0–34.0)
MCHC: 34.9 g/dL (ref 30.0–36.0)
MCV: 99.3 fL (ref 78.0–100.0)
MONO ABS: 0.1 10*3/uL (ref 0.1–1.0)
MONOS PCT: 2 %
Neutro Abs: 1.7 10*3/uL (ref 1.7–7.7)
Neutrophils Relative %: 55 %
PLATELETS: 96 10*3/uL — AB (ref 150–400)
RBC: 2.71 MIL/uL — AB (ref 3.87–5.11)
RDW: 24.3 % — ABNORMAL HIGH (ref 11.5–15.5)
WBC: 3.2 10*3/uL — AB (ref 4.0–10.5)

## 2017-04-29 LAB — HIV ANTIBODY (ROUTINE TESTING W REFLEX): HIV SCREEN 4TH GENERATION: NONREACTIVE

## 2017-04-29 LAB — TSH: TSH: 0.112 u[IU]/mL — ABNORMAL LOW (ref 0.350–4.500)

## 2017-04-29 MED ORDER — CYANOCOBALAMIN 1000 MCG/ML IJ SOLN: 1000.0000 ug | INTRAMUSCULAR | 7 refills | Status: DC

## 2017-04-29 NOTE — Discharge Summary (Signed)
Physician Discharge Summary  [name] BSW:[id number] DOB: Dec 18, 1950 DOA: 04/28/2017  PCP: Harlan Stains, MD  Admit date: 04/28/2017 Discharge date: 04/29/2017  Admitted From: home  Disposition:  Home   Recommendations for Outpatient Follow-up:  1. Follow up with PCP in 1-2 weeks 2. Vitamin B12 weekly for another 3 weeks, followed by vitamin B12 monthly IM 3. Screening for celiac disease and intrinsic factor antibodies done, results are pending at the time of discharge, would like PCP to follow-up on the results  Home Health: none Equipment/Devices: none   Discharge Condition: stable CODE STATUS: Full code Diet recommendation: regular  HPI: Alexis Ochoa is a 66 y.o. female with medical history significant of hypertension, hypothyroidism, prior cholecystectomy, presents to the emergency room with chief complaint of weakness and fatigue, and was also sent by her PCP due to low hemoglobin.  Patient tells me that she has been trying to donate blood last week, and was refused due to having a low hemoglobin, and was directed toward PCP.  She saw her PCP and her hemoglobin was decreased in the 7 range, and was related to the emergency room for further evaluation and treatment.  She complains of feeling lightheaded a little bit weak in the last 1-2 weeks, however has no other symptoms.  She denies any chest pain, she denies any shortness of breath.  She has no abdominal pain, no nausea vomiting or diarrhea.  She occasionally has loose stools but nothing out of the ordinary.  She reports 1 left upper quadrant abdominal cramping she is here yesterday, now resolved.  She denies any frank fever or chills, however complains of night sweats on and off for the past 1-2 months.  She is also reporting for 40 pound weight loss in the last 4-6 months.  She is not smoking, however has a strong history of secondhand exposure to cigarette smoke as her husband is a heavy smoker.  She denies alcohol abuse. ED  Course: In the emergency room patient is afebrile, normotensive, satting well on room air.  Blood work is remarkable for normal kidney function, anemia panel reveals normal iron and ferritin however vitamin B12 is undetectable.  She is pancytopenic with a white count of 2.3, hemoglobin of 6.9 and platelets of 125.  TRH was asked for admission for pancytopenia of unclear etiology  Hospital Course: Discharge Diagnoses:  Active Problems:   Essential hypertension, benign   Thyroid disease   Pancytopenia (HCC)   Weight loss   Pancytopenia due to profound vitamin B12 deficiency -Patient with what appears to be an acute on chronic component of anemia, she was transfused 2 units of packed red blood cells with appropriate response.  No evidence of blood loss with negative fecal occult and no reports of bleeding or melena.  She is up-to-date with her outpatient colonoscopies per patient.  Anemia panel revealed significantly low vitamin B12.  She was given a dose of vitamin B12 IM, and was recommended to get this weekly for 1 month then monthly afterwards.  Labs were sent for celiac disease as well as intrinsic factor antibodies, these are pending at the time of discharge, have advised patient to follow-up with her PCP regarding final results Weight loss/B type symptoms -Concerning for underlying malignancy given 40 pound weight loss over the last few months, unintentional.  She underwent a CT scan of the chest abdomen pelvis which did not show any significant lymphadenopathy/masses, slightly heterogeneous and enlarged spleen but otherwise is unremarkable Hypothyroidism -Continue home Synthroid, TSH  was slightly decreased to 0.112, would recommend repeat TSH values in 2-3 weeks and if it is persistently low would change Synthroid dose   Discharge Instructions   Allergies as of 04/29/2017   No Known Allergies     Medication List    TAKE these medications   aspirin EC 81 MG tablet Take 81 mg by mouth  daily.   cholecalciferol 1000 units tablet Commonly known as:  VITAMIN D Take 1,000 Units by mouth at bedtime.   cyanocobalamin 1000 MCG/ML injection Commonly known as:  (VITAMIN B-12) Inject 1 mL (1,000 mcg total) into the muscle once a week. Weekly for 3 more doses then monthly   desoximetasone 0.25 % cream Commonly known as:  TOPICORT Apply 1 application topically as needed ([place] Scrosis).   levothyroxine 112 MCG tablet Commonly known as:  SYNTHROID, LEVOTHROID Take 112 mcg by mouth at bedtime.   potassium chloride 10 MEQ tablet Commonly known as:  K-DUR Take 1 tablet (10 mEq total) by mouth daily. Given at discharge from [place], Cynthia, MD. Schedule an appointment as soon as possible for a visit in 3 week(s).   Specialty:  Family Medicine Contact information: [address] [place] [address] [phone]          No Known Allergies  Consultations:  None   Procedures/Studies:  Ct Chest W Contrast  Result Date: 04/29/2017 CLINICAL DATA:  Weight loss with pancytopenia and anemia EXAM: CT CHEST, ABDOMEN, AND PELVIS WITH CONTRAST TECHNIQUE: Multidetector CT imaging of the chest, abdomen and pelvis was performed following the standard protocol during bolus administration of intravenous contrast. CONTRAST:  141mL ISOVUE-300 IOPAMIDOL (ISOVUE-300) INJECTION 61% COMPARISON:  Ultrasound 09/29/2013 FINDINGS: CT CHEST FINDINGS Cardiovascular: Minimal atherosclerotic calcification in the aorta. Non aneurysmal aorta. Heart size slightly enlarged. No pericardial effusion. Mediastinum/Nodes: No enlarged mediastinal, hilar, or axillary lymph nodes. Thyroid gland, trachea, and esophagus demonstrate no significant findings. Lungs/Pleura: Lungs are clear. No pleural effusion or pneumothorax. Musculoskeletal: No chest wall mass or suspicious bone lesions identified. CT ABDOMEN PELVIS FINDINGS Hepatobiliary: No focal liver  abnormality is seen. Status post cholecystectomy. No biliary dilatation. Pancreas: Unremarkable. No pancreatic ductal dilatation or surrounding inflammatory changes. Spleen: Heterogenous splenic enhancement likely attributable to phase of imaging. The spleen is slightly enlarged at 13 cm. Adrenals/Urinary Tract: Adrenal glands are unremarkable. Kidneys are normal, without renal calculi, focal lesion, or hydronephrosis. Bladder is unremarkable. Stomach/Bowel: Stomach is within normal limits. Appendix appears normal. No evidence of bowel wall thickening, distention, or inflammatory changes. Vascular/Lymphatic: No significant vascular findings are present. No enlarged abdominal or pelvic lymph nodes. Reproductive: Status post hysterectomy. No adnexal masses. Other: No free air or free fluid. Musculoskeletal: No acute or suspicious bone lesion. IMPRESSION: 1. Negative CT of the chest with contrast 2. Slightly heterogenous enlarged spleen. 3. Otherwise negative CT examination of the abdomen and pelvis. Electronically Signed   By: Donavan Foil M.D.   On: 04/29/2017 00:31   Ct Abdomen Pelvis W Contrast  Result Date: 04/29/2017 CLINICAL DATA:  Weight loss with pancytopenia and anemia EXAM: CT CHEST, ABDOMEN, AND PELVIS WITH CONTRAST TECHNIQUE: Multidetector CT imaging of the chest, abdomen and pelvis was performed following the standard protocol during bolus administration of intravenous contrast. CONTRAST:  149mL ISOVUE-300 IOPAMIDOL (ISOVUE-300) INJECTION 61% COMPARISON:  Ultrasound 09/29/2013 FINDINGS: CT CHEST FINDINGS Cardiovascular: Minimal atherosclerotic calcification in the aorta. Non aneurysmal aorta. Heart size slightly enlarged. No pericardial effusion. Mediastinum/Nodes:  No enlarged mediastinal, hilar, or axillary lymph nodes. Thyroid gland, trachea, and esophagus demonstrate no significant findings. Lungs/Pleura: Lungs are clear. No pleural effusion or pneumothorax. Musculoskeletal: No chest wall mass or  suspicious bone lesions identified. CT ABDOMEN PELVIS FINDINGS Hepatobiliary: No focal liver abnormality is seen. Status post cholecystectomy. No biliary dilatation. Pancreas: Unremarkable. No pancreatic ductal dilatation or surrounding inflammatory changes. Spleen: Heterogenous splenic enhancement likely attributable to phase of imaging. The spleen is slightly enlarged at 13 cm. Adrenals/Urinary Tract: Adrenal glands are unremarkable. Kidneys are normal, without renal calculi, focal lesion, or hydronephrosis. Bladder is unremarkable. Stomach/Bowel: Stomach is within normal limits. Appendix appears normal. No evidence of bowel wall thickening, distention, or inflammatory changes. Vascular/Lymphatic: No significant vascular findings are present. No enlarged abdominal or pelvic lymph nodes. Reproductive: Status post hysterectomy. No adnexal masses. Other: No free air or free fluid. Musculoskeletal: No acute or suspicious bone lesion. IMPRESSION: 1. Negative CT of the chest with contrast 2. Slightly heterogenous enlarged spleen. 3. Otherwise negative CT examination of the abdomen and pelvis. Electronically Signed   By: Donavan Foil M.D.   On: 04/29/2017 00:31      Subjective: - no chest pain, shortness of breath, no abdominal pain, nausea or vomiting.   Discharge Exam: Vitals:   04/28/17 2332 04/29/17 0526  BP: (!) 162/51 139/61  Pulse: (!) 57 (!) 52  Resp: 18 16  Temp: 98.5 F (36.9 C) 98.1 F (36.7 C)   Vitals:   04/28/17 1945 04/28/17 2044 04/28/17 2332 04/29/17 0526  BP: (!) 154/61 (!) 151/76 (!) 162/51 139/61  Pulse: (!) 51 (!) 50 (!) 57 (!) 52  Resp: 18 18 18 16   Temp: 98.5 F (36.9 C) 98.4 F (36.9 C) 98.5 F (36.9 C) 98.1 F (36.7 C)  TempSrc: Oral Oral Oral Oral  SpO2:    100%  Weight:      Height:        General: Pt is alert, awake, not in acute distress Cardiovascular: RRR, S1/S2 +, no rubs, no gallops Respiratory: CTA bilaterally, no wheezing, no rhonchi Abdominal:  Soft, NT, ND, bowel sounds + Extremities: no edema, no cyanosis    The results of significant diagnostics from this hospitalization (including imaging, microbiology, ancillary and laboratory) are listed below for reference.     Microbiology: No results found for this or any previous visit (from the past 240 hour(s)).   Labs: BNP (last 3 results) No results for input(s): BNP in the last 8760 hours. Basic Metabolic Panel:  Recent Labs Lab 04/28/17 1405 04/29/17 0452  NA 137 139  K 3.6 3.7  CL 109 109  CO2 24 26  GLUCOSE 106* 90  BUN 8 8  CREATININE 0.50 0.58  CALCIUM 8.5* 8.4*   Liver Function Tests:  Recent Labs Lab 04/29/17 0452  AST 29  ALT 26  ALKPHOS 57  BILITOT 3.2*  PROT 5.2*  ALBUMIN 3.4*   No results for input(s): LIPASE, AMYLASE in the last 168 hours. No results for input(s): AMMONIA in the last 168 hours. CBC:  Recent Labs Lab 04/28/17 1405 04/28/17 1549 04/29/17 0452  WBC 2.3*  --  3.2*  NEUTROABS  --  1.5* 1.7  HGB 6.9*  --  9.4*  HCT 18.7*  --  26.9*  MCV 110.7*  --  99.3  PLT 125*  --  96*   Cardiac Enzymes: No results for input(s): CKTOTAL, CKMB, CKMBINDEX, TROPONINI in the last 168 hours. BNP: Invalid input(s): POCBNP CBG:  Recent Labs Lab  04/28/17 1717  GLUCAP 77   D-Dimer No results for input(s): DDIMER in the last 72 hours. Hgb A1c No results for input(s): HGBA1C in the last 72 hours. Lipid Profile No results for input(s): CHOL, HDL, LDLCALC, TRIG, CHOLHDL, LDLDIRECT in the last 72 hours. Thyroid function studies  Recent Labs  04/28/17 2000  TSH 0.112*   Anemia work up  Recent Labs  04/28/17 1549  VITAMINB12 <50*  FOLATE 33.0  FERRITIN 157  TIBC 256  IRON 115  RETICCTPCT 1.0   Urinalysis    Component Value Date/Time   COLORURINE YELLOW 04/28/2017 1426   APPEARANCEUR CLEAR 04/28/2017 1426   LABSPEC 1.009 04/28/2017 1426   PHURINE 5.0 04/28/2017 1426   GLUCOSEU NEGATIVE 04/28/2017 1426   HGBUR  NEGATIVE 04/28/2017 1426   BILIRUBINUR NEGATIVE 04/28/2017 1426   KETONESUR NEGATIVE 04/28/2017 1426   PROTEINUR NEGATIVE 04/28/2017 1426   UROBILINOGEN 0.2 09/29/2013 1807   NITRITE NEGATIVE 04/28/2017 1426   LEUKOCYTESUR NEGATIVE 04/28/2017 1426   Sepsis Labs Invalid input(s): PROCALCITONIN,  WBC,  LACTICIDVEN Microbiology No results found for this or any previous visit (from the past 240 hour(s)).   Time coordinating discharge: 20 minutes  SIGNED:  Marzetta Board, MD  [facility] 04/29/2017, 12:25 PM Pager [phone]  If 7PM-7AM, please contact night-coverage [email or web address] Password [email or web address]

## 2017-04-29 NOTE — Progress Notes (Signed)
Patient discharged home in stable condition. Verbalizes understanding of all discharge instructions, including home medications and follow up appointments. Pt taught how to do IM injection. Verbalizes understanding and reports that daughter knows how to do injections and she will be administering med.

## 2017-04-30 LAB — INTRINSIC FACTOR ANTIBODIES: Intrinsic Factor: 242.1 [AU]/ml — ABNORMAL HIGH (ref 0.0–1.1)

## 2017-04-30 LAB — TISSUE TRANSGLUTAMINASE, IGA: Tissue Transglutaminase Ab, IgA: <2 U/mL (ref 0–3)

## 2017-04-30 LAB — GLIADIN ANTIBODIES, SERUM
GLIADIN IGA: 2 U (ref 0–19)
GLIADIN IGG: 3 U (ref 0–19)

## 2017-05-01 LAB — RETICULIN ANTIBODIES, IGA W TITER: Reticulin Ab, IgA: NEGATIVE titer (ref <2.5)

## 2017-05-03 DIAGNOSIS — E039 Hypothyroidism, unspecified: Secondary | ICD-10-CM | POA: Diagnosis not present

## 2017-05-03 DIAGNOSIS — D510 Vitamin B12 deficiency anemia due to intrinsic factor deficiency: Secondary | ICD-10-CM | POA: Diagnosis not present

## 2017-05-03 DIAGNOSIS — R634 Abnormal weight loss: Secondary | ICD-10-CM | POA: Diagnosis not present

## 2017-05-03 DIAGNOSIS — D61818 Other pancytopenia: Secondary | ICD-10-CM | POA: Diagnosis not present

## 2017-05-03 DIAGNOSIS — Z800 Family history of malignant neoplasm of digestive organs: Secondary | ICD-10-CM | POA: Diagnosis not present

## 2017-05-05 DIAGNOSIS — D510 Vitamin B12 deficiency anemia due to intrinsic factor deficiency: Secondary | ICD-10-CM | POA: Diagnosis not present

## 2017-05-12 DIAGNOSIS — D510 Vitamin B12 deficiency anemia due to intrinsic factor deficiency: Secondary | ICD-10-CM | POA: Diagnosis not present

## 2017-05-19 DIAGNOSIS — D510 Vitamin B12 deficiency anemia due to intrinsic factor deficiency: Secondary | ICD-10-CM | POA: Diagnosis not present

## 2017-05-25 DIAGNOSIS — Z800 Family history of malignant neoplasm of digestive organs: Secondary | ICD-10-CM | POA: Diagnosis not present

## 2017-05-25 DIAGNOSIS — D510 Vitamin B12 deficiency anemia due to intrinsic factor deficiency: Secondary | ICD-10-CM | POA: Diagnosis not present

## 2017-06-07 DIAGNOSIS — D510 Vitamin B12 deficiency anemia due to intrinsic factor deficiency: Secondary | ICD-10-CM | POA: Diagnosis not present

## 2017-06-07 DIAGNOSIS — E039 Hypothyroidism, unspecified: Secondary | ICD-10-CM | POA: Diagnosis not present

## 2017-06-07 DIAGNOSIS — W57XXXA Bitten or stung by nonvenomous insect and other nonvenomous arthropods, initial encounter: Secondary | ICD-10-CM | POA: Diagnosis not present

## 2017-06-07 DIAGNOSIS — D61818 Other pancytopenia: Secondary | ICD-10-CM | POA: Diagnosis not present

## 2017-06-07 DIAGNOSIS — S2096XA Insect bite (nonvenomous) of unspecified parts of thorax, initial encounter: Secondary | ICD-10-CM | POA: Diagnosis not present

## 2017-07-07 DIAGNOSIS — D510 Vitamin B12 deficiency anemia due to intrinsic factor deficiency: Secondary | ICD-10-CM | POA: Diagnosis not present

## 2017-07-19 DIAGNOSIS — E039 Hypothyroidism, unspecified: Secondary | ICD-10-CM | POA: Diagnosis not present

## 2017-07-28 DIAGNOSIS — D510 Vitamin B12 deficiency anemia due to intrinsic factor deficiency: Secondary | ICD-10-CM | POA: Diagnosis not present

## 2017-07-28 DIAGNOSIS — Z800 Family history of malignant neoplasm of digestive organs: Secondary | ICD-10-CM | POA: Diagnosis not present

## 2017-08-04 DIAGNOSIS — D510 Vitamin B12 deficiency anemia due to intrinsic factor deficiency: Secondary | ICD-10-CM | POA: Diagnosis not present

## 2017-08-24 DIAGNOSIS — Z1211 Encounter for screening for malignant neoplasm of colon: Secondary | ICD-10-CM | POA: Diagnosis not present

## 2017-08-24 DIAGNOSIS — K3189 Other diseases of stomach and duodenum: Secondary | ICD-10-CM | POA: Diagnosis not present

## 2017-08-24 DIAGNOSIS — K648 Other hemorrhoids: Secondary | ICD-10-CM | POA: Diagnosis not present

## 2017-08-24 DIAGNOSIS — Z800 Family history of malignant neoplasm of digestive organs: Secondary | ICD-10-CM | POA: Diagnosis not present

## 2017-08-24 DIAGNOSIS — D510 Vitamin B12 deficiency anemia due to intrinsic factor deficiency: Secondary | ICD-10-CM | POA: Diagnosis not present

## 2017-08-24 DIAGNOSIS — K5289 Other specified noninfective gastroenteritis and colitis: Secondary | ICD-10-CM | POA: Diagnosis not present

## 2017-08-24 DIAGNOSIS — K529 Noninfective gastroenteritis and colitis, unspecified: Secondary | ICD-10-CM | POA: Diagnosis not present

## 2017-08-24 DIAGNOSIS — K2940 Chronic atrophic gastritis without bleeding: Secondary | ICD-10-CM | POA: Diagnosis not present

## 2017-08-30 DIAGNOSIS — Z1211 Encounter for screening for malignant neoplasm of colon: Secondary | ICD-10-CM | POA: Diagnosis not present

## 2017-08-30 DIAGNOSIS — K3189 Other diseases of stomach and duodenum: Secondary | ICD-10-CM | POA: Diagnosis not present

## 2017-08-30 DIAGNOSIS — K529 Noninfective gastroenteritis and colitis, unspecified: Secondary | ICD-10-CM | POA: Diagnosis not present

## 2017-09-01 DIAGNOSIS — D510 Vitamin B12 deficiency anemia due to intrinsic factor deficiency: Secondary | ICD-10-CM | POA: Diagnosis not present

## 2017-09-07 DIAGNOSIS — E559 Vitamin D deficiency, unspecified: Secondary | ICD-10-CM | POA: Diagnosis not present

## 2017-09-07 DIAGNOSIS — D61818 Other pancytopenia: Secondary | ICD-10-CM | POA: Diagnosis not present

## 2017-09-07 DIAGNOSIS — E2839 Other primary ovarian failure: Secondary | ICD-10-CM | POA: Diagnosis not present

## 2017-09-07 DIAGNOSIS — E039 Hypothyroidism, unspecified: Secondary | ICD-10-CM | POA: Diagnosis not present

## 2017-09-07 DIAGNOSIS — Z1231 Encounter for screening mammogram for malignant neoplasm of breast: Secondary | ICD-10-CM | POA: Diagnosis not present

## 2017-09-07 DIAGNOSIS — D510 Vitamin B12 deficiency anemia due to intrinsic factor deficiency: Secondary | ICD-10-CM | POA: Diagnosis not present

## 2017-09-30 DIAGNOSIS — D510 Vitamin B12 deficiency anemia due to intrinsic factor deficiency: Secondary | ICD-10-CM | POA: Diagnosis not present

## 2017-10-28 DIAGNOSIS — D510 Vitamin B12 deficiency anemia due to intrinsic factor deficiency: Secondary | ICD-10-CM | POA: Diagnosis not present

## 2017-11-11 DIAGNOSIS — E2839 Other primary ovarian failure: Secondary | ICD-10-CM | POA: Diagnosis not present

## 2017-11-11 DIAGNOSIS — M8588 Other specified disorders of bone density and structure, other site: Secondary | ICD-10-CM | POA: Diagnosis not present

## 2017-11-30 DIAGNOSIS — D510 Vitamin B12 deficiency anemia due to intrinsic factor deficiency: Secondary | ICD-10-CM | POA: Diagnosis not present

## 2017-12-28 DIAGNOSIS — D510 Vitamin B12 deficiency anemia due to intrinsic factor deficiency: Secondary | ICD-10-CM | POA: Diagnosis not present

## 2018-01-28 DIAGNOSIS — D510 Vitamin B12 deficiency anemia due to intrinsic factor deficiency: Secondary | ICD-10-CM | POA: Diagnosis not present

## 2018-03-01 DIAGNOSIS — L900 Lichen sclerosus et atrophicus: Secondary | ICD-10-CM | POA: Diagnosis not present

## 2018-03-01 DIAGNOSIS — Z0000 Encounter for general adult medical examination without abnormal findings: Secondary | ICD-10-CM | POA: Diagnosis not present

## 2018-03-01 DIAGNOSIS — E039 Hypothyroidism, unspecified: Secondary | ICD-10-CM | POA: Diagnosis not present

## 2018-03-01 DIAGNOSIS — Z136 Encounter for screening for cardiovascular disorders: Secondary | ICD-10-CM | POA: Diagnosis not present

## 2018-03-01 DIAGNOSIS — I10 Essential (primary) hypertension: Secondary | ICD-10-CM | POA: Diagnosis not present

## 2018-03-01 DIAGNOSIS — Z23 Encounter for immunization: Secondary | ICD-10-CM | POA: Diagnosis not present

## 2018-03-01 DIAGNOSIS — D510 Vitamin B12 deficiency anemia due to intrinsic factor deficiency: Secondary | ICD-10-CM | POA: Diagnosis not present

## 2018-03-01 DIAGNOSIS — R001 Bradycardia, unspecified: Secondary | ICD-10-CM | POA: Diagnosis not present

## 2018-03-01 DIAGNOSIS — E559 Vitamin D deficiency, unspecified: Secondary | ICD-10-CM | POA: Diagnosis not present

## 2018-03-03 ENCOUNTER — Other Ambulatory Visit: Payer: Self-pay | Admitting: Family Medicine

## 2018-03-03 DIAGNOSIS — Z1231 Encounter for screening mammogram for malignant neoplasm of breast: Secondary | ICD-10-CM

## 2018-03-09 ENCOUNTER — Telehealth: Payer: Self-pay

## 2018-03-09 NOTE — Telephone Encounter (Signed)
NOTES FAXED TO [facility] °

## 2018-03-11 ENCOUNTER — Telehealth: Payer: Self-pay | Admitting: Cardiology

## 2018-03-11 NOTE — Telephone Encounter (Signed)
Received incoming records from [place] [facility] for up coming appointment on 05/03/2018 with Dr Ellyn Hack.  Records given to [facility] in Medical Records 03/11/2018 [facility]

## 2018-03-29 ENCOUNTER — Ambulatory Visit
Admission: RE | Admit: 2018-03-29 | Discharge: 2018-03-29 | Disposition: A | Discharge status: Home / Self Care | Payer: PPO | Source: Ambulatory Visit | Attending: Family Medicine | Admitting: Family Medicine

## 2018-03-29 DIAGNOSIS — Z1231 Encounter for screening mammogram for malignant neoplasm of breast: Secondary | ICD-10-CM | POA: Diagnosis not present

## 2018-04-01 DIAGNOSIS — D510 Vitamin B12 deficiency anemia due to intrinsic factor deficiency: Secondary | ICD-10-CM | POA: Diagnosis not present

## 2018-04-03 IMAGING — CT CT CHEST W/ CM
3 of 9 series · 11 of 46 positions shown, 17 images · IV contrast (iopamidol)
Comparison: Ultrasound 09/29/2013

CLINICAL DATA: Weight loss with pancytopenia and anemia

EXAM:
CT CHEST, ABDOMEN, AND PELVIS WITH CONTRAST
TECHNIQUE: Multidetector CT imaging of the chest, abdomen and pelvis was
performed following the standard protocol during bolus
administration of intravenous contrast.
CONTRAST:  100mL NA599C-DKK IOPAMIDOL (NA599C-DKK) INJECTION 61%

[Series 5: cap with · axial · 0.54mm/px · z∈[+1587,+1727]mm · 3 of 56 slices shown (1 of 2)]
[im 14/56  soft-tissue]
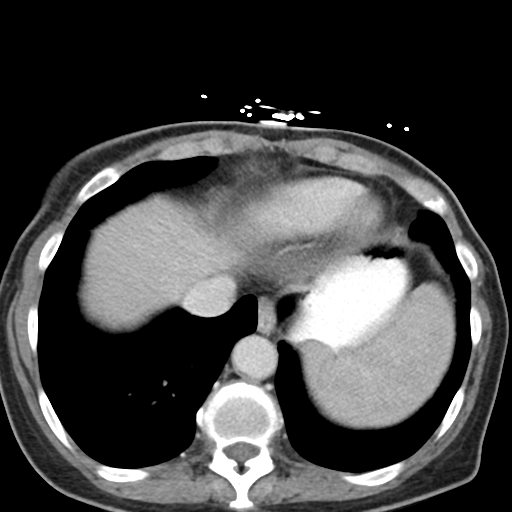
[im 28/56  soft-tissue]
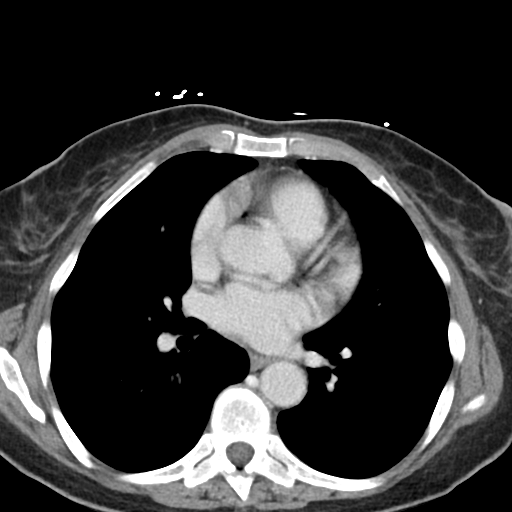
[im 42/56  soft-tissue]
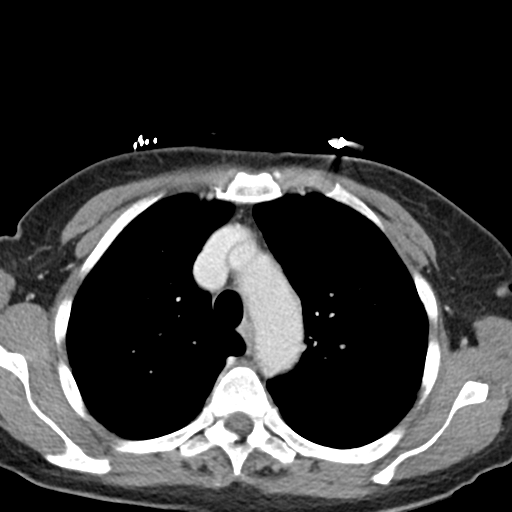

[Series 8: cor · coronal · 0.55mm/px · 3 of 75 slices shown, 4 images]
[im 19/75  soft-tissue]
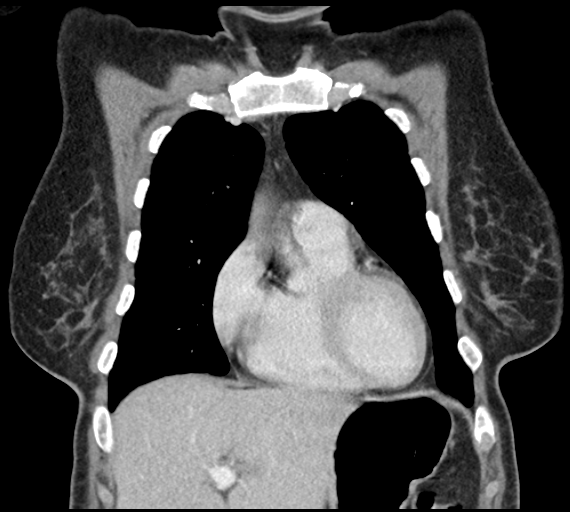
[im 38/75  soft-tissue]
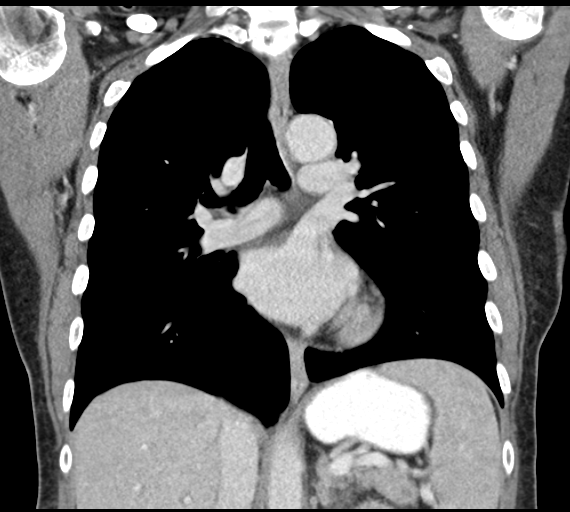
[im 38/75  bone]
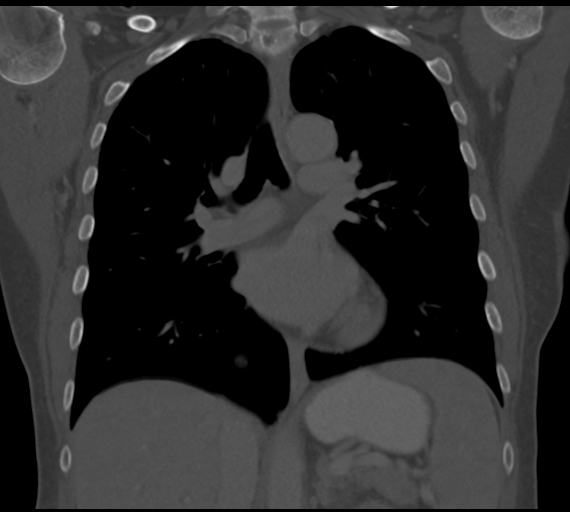
[im 56/75  soft-tissue]
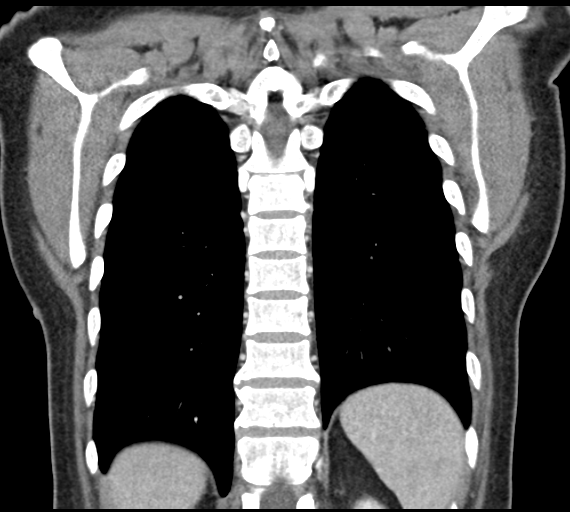

[Series 13: cap with · axial · 0.68mm/px · z∈[+1377,+1652]mm · 5 of 83 slices shown, 10 images (2 of 2)]
[im 14/83  soft-tissue]
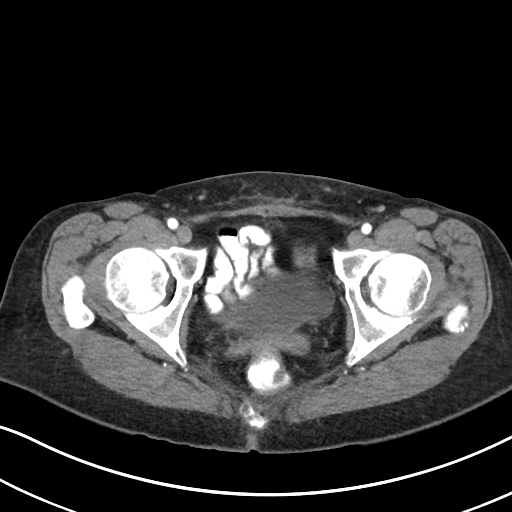
[im 14/83  bone]
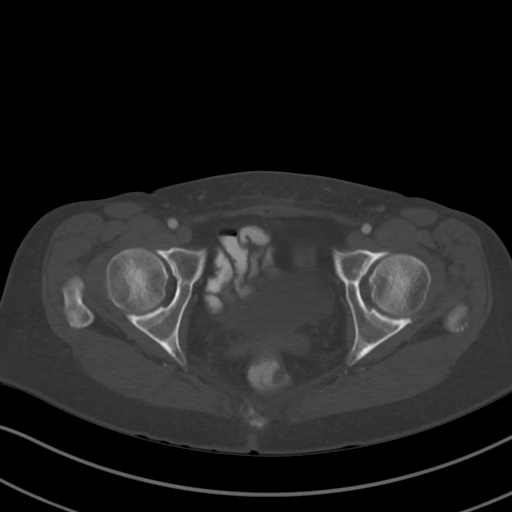
[im 28/83  soft-tissue]
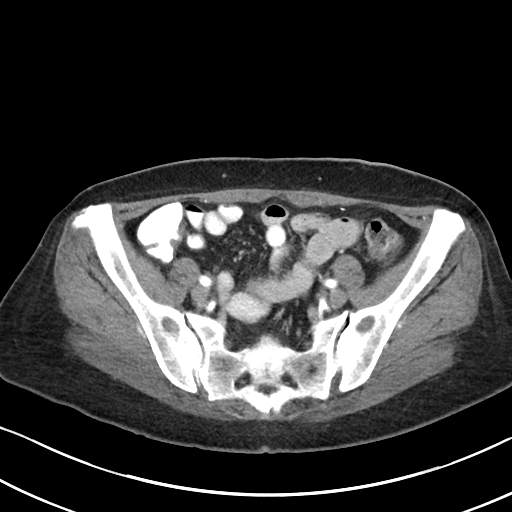
[im 28/83  lung]
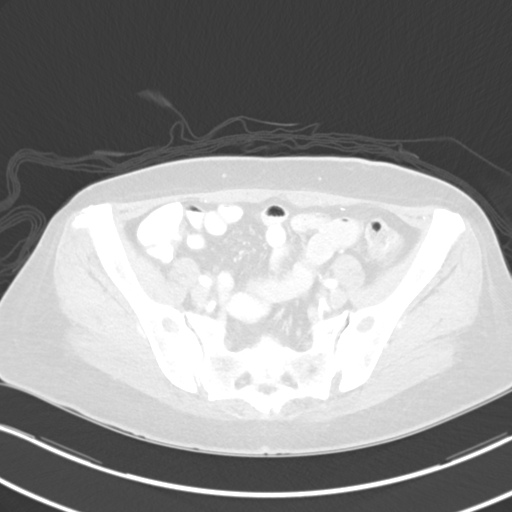
[im 42/83  soft-tissue]
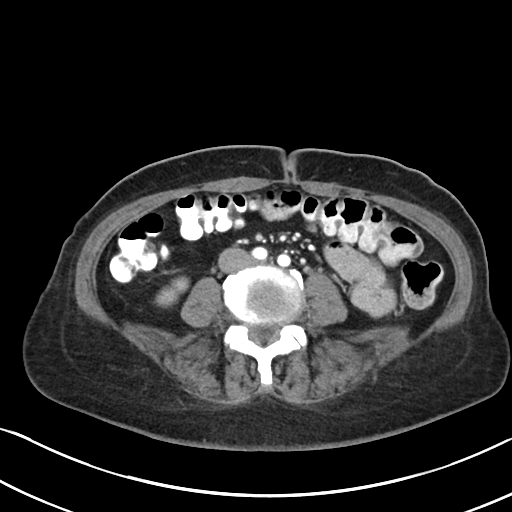
[im 42/83  lung]
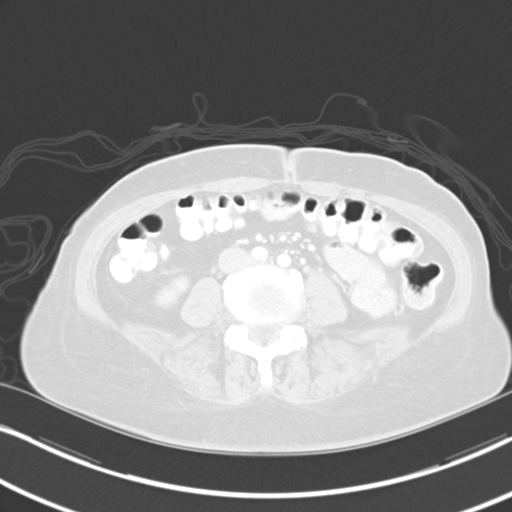
[im 55/83  soft-tissue]
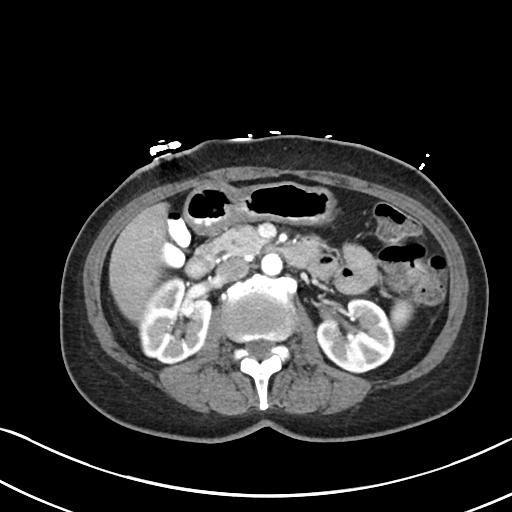
[im 55/83  lung]
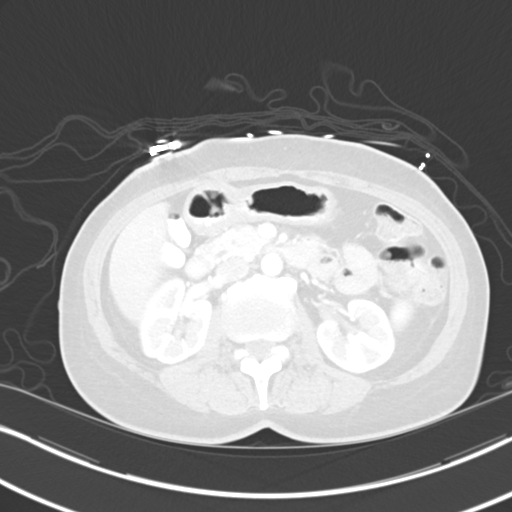
[im 69/83  soft-tissue]
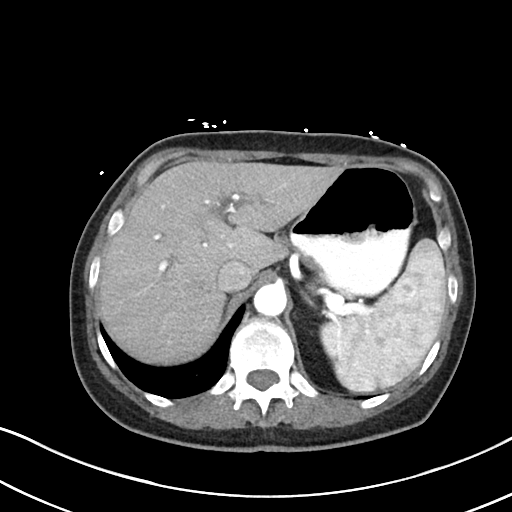
[im 69/83  lung]
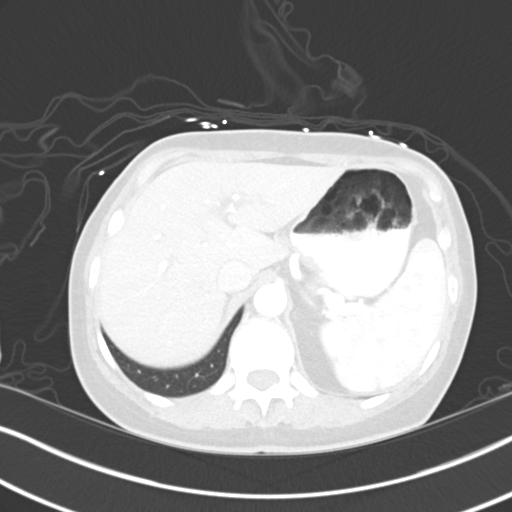

[11 of 46 positions shown; findings below may reference images not displayed]

FINDINGS: CT CHEST FINDINGS

Cardiovascular: Minimal atherosclerotic calcification in the aorta.
Non aneurysmal aorta. Heart size slightly enlarged. No pericardial
effusion.

Mediastinum/Nodes: No enlarged mediastinal, hilar, or axillary lymph
nodes. Thyroid gland, trachea, and esophagus demonstrate no
significant findings.

Lungs/Pleura: Lungs are clear. No pleural effusion or pneumothorax.

Musculoskeletal: No chest wall mass or suspicious bone lesions
identified.

CT ABDOMEN PELVIS FINDINGS

Hepatobiliary: No focal liver abnormality is seen. Status post
cholecystectomy. No biliary dilatation.

Pancreas: Unremarkable. No pancreatic ductal dilatation or
surrounding inflammatory changes.

Spleen: Heterogenous splenic enhancement likely attributable to
phase of imaging. The spleen is slightly enlarged at 13 cm.

Adrenals/Urinary Tract: Adrenal glands are unremarkable. Kidneys are
normal, without renal calculi, focal lesion, or hydronephrosis.
Bladder is unremarkable.

Stomach/Bowel: Stomach is within normal limits. Appendix appears
normal. No evidence of bowel wall thickening, distention, or
inflammatory changes.

Vascular/Lymphatic: No significant vascular findings are present. No
enlarged abdominal or pelvic lymph nodes.

Reproductive: Status post hysterectomy. No adnexal masses.

Other: No free air or free fluid.

Musculoskeletal: No acute or suspicious bone lesion.
IMPRESSION: 1. Negative CT of the chest with contrast
2. Slightly heterogenous enlarged spleen.
3. Otherwise negative CT examination of the abdomen and pelvis.

## 2018-04-26 DIAGNOSIS — H524 Presbyopia: Secondary | ICD-10-CM | POA: Diagnosis not present

## 2018-04-26 DIAGNOSIS — H5203 Hypermetropia, bilateral: Secondary | ICD-10-CM | POA: Diagnosis not present

## 2018-04-26 DIAGNOSIS — H2513 Age-related nuclear cataract, bilateral: Secondary | ICD-10-CM | POA: Diagnosis not present

## 2018-04-26 DIAGNOSIS — H25013 Cortical age-related cataract, bilateral: Secondary | ICD-10-CM | POA: Diagnosis not present

## 2018-05-03 ENCOUNTER — Encounter: Payer: Self-pay | Admitting: Cardiology

## 2018-05-03 ENCOUNTER — Ambulatory Visit (INDEPENDENT_AMBULATORY_CARE_PROVIDER_SITE_OTHER): Payer: PPO | Admitting: Cardiology

## 2018-05-03 VITALS — BP 185/78 | HR 48 | Ht 61.0 in | Wt 135.4 lb

## 2018-05-03 DIAGNOSIS — I10 Essential (primary) hypertension: Secondary | ICD-10-CM | POA: Diagnosis not present

## 2018-05-03 DIAGNOSIS — R001 Bradycardia, unspecified: Secondary | ICD-10-CM | POA: Diagnosis not present

## 2018-05-03 NOTE — Progress Notes (Signed)
PCP: Harlan Stains, MD  Clinic Note: Chief Complaint  Patient presents with  . New Patient (Initial Visit)    Pt states no Sx.   . Bradycardia    no real Sx    HPI: Alexis Ochoa is a 67 y.o. female with a PMH below who is being seen today for the evaluation of BRADYCARDIA at the request of Harlan Stains, MD.  Alexis Ochoa was last seen on May 14th by Dr. Dema Severin for routine f/u - HTN & Hypothyroidism & pernicious anemia --> noted to had HR of 48 bpm on EKG.  Described some Sx of fatigue --> referred for Cardiology evaluation.  Recent Hospitalizations: none  Studies Personally Reviewed - (if available, images/films reviewed: From Epic Chart or Care Everywhere)  none  Interval History: Alexis Ochoa presents today really with no significant cardiac symptoms.  Says that she is always "on the go.  Does not really do routine exercise with exception of an exercise class II days a week.  She does [employer] and was therefore and out of her car quite a bit walking to houses.  She denies any resting or exertional dyspnea or chest pain/pressure.  No PND, orthopnea or edema.  With her activity level, she denies any sensation of inability to increase her heart rate/fatigue from a standpoint of chronotropic incompetence.  Her blood pressure is elevated today but she just found out after checking into the clinic room that her sister-in-law just died (not unexpectedly as far as chronic illness, but she was not expecting the right  at this point, and was hoping to go see her after this clinic visit.)    She denies any rapid irregular heartbeats or palpitations.  No skipping sensation.  No syncope/near syncope or TIA/amaurosis fugax.  No claudication.  ROS: A comprehensive was performed. Review of Systems  Constitutional: Positive for malaise/fatigue (May be a little bit tired, but is treated this to her vitamin B12 deficiency -- PCP is checking TSH, CBC & Vit D).  HENT: Negative for congestion.     Respiratory: Negative for cough, shortness of breath and wheezing.   Gastrointestinal: Negative.  Negative for abdominal pain and heartburn.  Genitourinary:       Occasional stress incontinence  Neurological: Negative for dizziness, weakness and headaches.  Psychiatric/Behavioral: Positive for depression.       A little upset at the time of the visit - re: sister-in-law just died.  All other systems reviewed and are negative.    I have reviewed and (if needed) personally updated the patient's problem list, medications, allergies, past medical and surgical history, social and family history.   Past Medical History:  Diagnosis Date  . History of blood transfusion 1970; 04/28/2017   "related to childbirth; related to anemia" (04/28/2017 - admitted with Pancytopenia - Dx Pernicious Anemia - Vit B12 deficiency)  . Hypertension   . Hypothyroidism    On levothyroxine  . Iron deficiency anemia 04/28/2017  . Pancytopenia ([place]) 04/28/2017  . Pernicious anemia   . Situational depression    "when husband was real sick; when adopting grandchildren"    Past Surgical History:  Procedure Laterality Date  . ABDOMINAL HYSTERECTOMY  ~ 1991-1992   "still have my ovaries"  . EXPLORATORY LAPAROTOMY  1980s   "before hysterectomy"  . LAPAROSCOPIC CHOLECYSTECTOMY  11/2013    Current Meds  Medication Sig  . aspirin EC 81 MG tablet Take 81 mg by mouth daily.  . cholecalciferol (VITAMIN D) 1000 UNITS  tablet Take 1,000 Units by mouth at bedtime.   . cyanocobalamin (,VITAMIN B-12,) 1000 MCG/ML injection Inject 1 mL (1,000 mcg total) into the muscle once a week. Weekly for 3 more doses then monthly  . 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  Marland Kitchen levothyroxine (SYNTHROID, LEVOTHROID) 112 MCG tablet Take 112 mcg by mouth at bedtime.   . [DISCONTINUED] potassium chloride (K-DUR) 10 MEQ tablet Take 1 tablet (10 mEq total) by mouth daily. Given at  discharge from [facility]    No Known Allergies  Social History   Tobacco Use  . Smoking status: Never Smoker  . Smokeless tobacco: Never Used  Substance Use Topics  . Alcohol use: No  . Drug use: No   Social History   Social History Narrative   She is widowed x1" 2011) remarried 2012 to her second husband Ruthann Cancer).   She has 2 daughters Santiago Glad, Angelyn (negative [place]).   She adopted and raised 3 granddaughters Barnette, Gregary Signs and [place]).   She never smoked, but did have excessive secondhand smoke.  Does not drink alcohol or caffeine.   Does to [facility] classes a [facility] at the senior center (aerobics and yoga).   She currently works as Chief Executive Officer person.    family history includes Asthma in her father; Cancer in her brother and sister; Diabetes in her brother; Heart attack in her mother; Hypertension in her brother, mother, sister, and sister; Stroke in her mother.  Wt Readings from Last 3 Encounters:  05/03/18 135 lb 6.4 oz (61.4 kg)  04/28/17 122 lb (55.3 kg)  12/22/13 150 lb 3.2 oz (68.1 kg)    PHYSICAL EXAM BP (!) 185/78   Pulse (!) 48   Ht 5\' 1"  (1.549 m)   Wt 135 lb 6.4 oz (61.4 kg)   BMI 25.58 kg/m  Physical Exam  Constitutional: She is oriented to person, place, and time. She appears well-developed and well-nourished. No distress.  Healthy-appearing.  Well-groomed. --Notably " shaken up after just finding out that her sis-in-law just died"  HENT:  Head: Normocephalic and atraumatic.  Eyes: Pupils are equal, round, and reactive to light. Conjunctivae and EOM are normal. No scleral icterus.  Neck: Normal range of motion. Neck supple. No hepatojugular reflux and no JVD present. Carotid bruit is not present. No thyromegaly present.  Pulmonary/Chest: Effort normal and breath sounds normal. No respiratory distress. She has no wheezes. She has no rales.  Abdominal: Soft. Bowel sounds are normal. She exhibits no distension. There is no tenderness. There is no rebound.    No HSM  Musculoskeletal: Normal range of motion. She exhibits no edema.  Lymphadenopathy:    She has no cervical adenopathy.  Neurological: She is alert and oriented to person, place, and time. No cranial nerve deficit.  Skin: Skin is warm and dry.  Psychiatric: She has a normal mood and affect. Her behavior is normal. Judgment and thought content normal.  SAD - stressed (see above)  Vitals reviewed.    Adult ECG Report  Rate: 48 ;  Rhythm: sinus bradycardia and Inc RBBB, Left Axis Deviation (-42 deg).  Normal intervals & durations;   Narrative Interpretation: Stable compared to PCP>   Other studies Reviewed: Additional studies/ records that were reviewed today include:  Recent Labs: 03/01/2018 (PCP)  Na+ 143, K+ 4.3, Cl- 107, HCO3- 31, BUN 11, Cr 0.66, Glu 90, Ca2+ 9.6; AST 18, ALT 13, AlkP 98  CBC: W 5.1, H/H 13.9/40.8, Plt 196  TC 165, TG 66, HDL 54, LDL 98   ASSESSMENT / PLAN: Problem List Items Addressed This Visit    Sinus bradycardia (Chronic)    Pretty much asymptomatic and seemingly chronic mild sinus bradycardia.  She is not on any AV nodal related agents. Plan: Evaluate for chronotropic incompetence determinewith GXT --Extent of bradycardia -24-hour ambulatory monitoring with 48-hour monitor.      Relevant Orders   EKG 12-Lead (Completed)   EXERCISE TOLERANCE TEST (ETT)   HOLTER MONITOR - 48 HOUR   Essential hypertension, benign - Primary (Chronic)    Blood pressure is quite high today.  Was 139/85 PCP office. Probably related to being under stress with her sister-in-law just dying. Can reassess a GXT (See blood pressure response to exercise. Currently on no antiplatelet agents. Would avoid AV nodal agents.         I spent a total of 35-40  minutes with the patient and chart review. >  50% of the time was spent in direct patient consultation.   Current medicines are reviewed at length with the patient today.  (+/- concerns) none The following  changes have been made:  none  Patient Instructions  NO MEDICATION CHANGES      SCHEDULE AT [facility] Your physician has recommended that you wear a holter monitor 48 HOURS . Holter monitors are medical devices that record the heart's electrical activity. Doctors most often use these monitors to diagnose arrhythmias. Arrhythmias are problems with the speed or rhythm of the heartbeat. The monitor is a small, portable device. You can wear one while you do your normal daily activities. This is usually used to diagnose what is causing palpitations/syncope (passing out).  SCHEDULE AT [facility] has requested that you have an exercise tolerance test. For further information please visit [email or web address]. Please also follow instruction sheet, as given.    Your physician recommends that you schedule a follow-up appointment in 2 [place].  If you need a refill on your cardiac medications before your next appointment, please call your pharmacy.     Studies Ordered:   Orders Placed This Encounter  Procedures  . EXERCISE TOLERANCE TEST (ETT)  . HOLTER MONITOR - 48 HOUR  . EKG 12-Lead      Glenetta Hew, M.D., M.S. Interventional Cardiologist   Pager # [phone] Phone # [phone] [address] [place], [place] [address]   Thank you for choosing [facility] at [facility]!!

## 2018-05-03 NOTE — Patient Instructions (Signed)
NO MEDICATION CHANGES      SCHEDULE AT [facility] has recommended that you wear a holter monitor 48 HOURS . Holter monitors are medical devices that record the heart's electrical activity. Doctors most often use these monitors to diagnose arrhythmias. Arrhythmias are problems with the speed or rhythm of the heartbeat. The monitor is a small, portable device. You can wear one while you do your normal daily activities. This is usually used to diagnose what is causing palpitations/syncope (passing out).  SCHEDULE AT [place] has requested that you have an exercise tolerance test. For further information please visit [email or web address]. Please also follow instruction sheet, as given.    Your physician recommends that you schedule a follow-up appointment in 2 [place].  If you need a refill on your cardiac medications before your next appointment, please call your pharmacy.

## 2018-05-04 DIAGNOSIS — E559 Vitamin D deficiency, unspecified: Secondary | ICD-10-CM | POA: Diagnosis not present

## 2018-05-08 ENCOUNTER — Encounter: Payer: Self-pay | Admitting: Cardiology

## 2018-05-08 NOTE — Assessment & Plan Note (Signed)
Blood pressure is quite high today.  Was 139/85 PCP office. Probably related to being under stress with her sister-in-law just dying. Can reassess a GXT (See blood pressure response to exercise. Currently on no antiplatelet agents. Would avoid AV nodal agents.

## 2018-05-08 NOTE — Assessment & Plan Note (Signed)
Pretty much asymptomatic and seemingly chronic mild sinus bradycardia.  She is not on any AV nodal related agents. Plan: Evaluate for chronotropic incompetence determinewith GXT --Extent of bradycardia -24-hour ambulatory monitoring with 48-hour monitor.

## 2018-05-19 HISTORY — PX: OTHER SURGICAL HISTORY: SHX169

## 2018-05-31 ENCOUNTER — Ambulatory Visit (INDEPENDENT_AMBULATORY_CARE_PROVIDER_SITE_OTHER): Payer: PPO

## 2018-05-31 DIAGNOSIS — R001 Bradycardia, unspecified: Secondary | ICD-10-CM

## 2018-06-01 ENCOUNTER — Telehealth (HOSPITAL_COMMUNITY): Payer: Self-pay

## 2018-06-01 NOTE — Telephone Encounter (Signed)
Encounter complete. 

## 2018-06-02 DIAGNOSIS — D510 Vitamin B12 deficiency anemia due to intrinsic factor deficiency: Secondary | ICD-10-CM | POA: Diagnosis not present

## 2018-06-03 ENCOUNTER — Ambulatory Visit (HOSPITAL_COMMUNITY)
Admission: RE | Admit: 2018-06-03 | Discharge: 2018-06-03 | Disposition: A | Discharge status: Home / Self Care | Payer: PPO | Source: Ambulatory Visit | Attending: Cardiology | Admitting: Cardiology

## 2018-06-03 DIAGNOSIS — R001 Bradycardia, unspecified: Secondary | ICD-10-CM | POA: Insufficient documentation

## 2018-06-05 LAB — EXERCISE TOLERANCE TEST
CHL CUP MPHR: 154 {beats}/min
Estimated workload: 10.1 METS
Exercise duration (min): 9 min
Exercise duration (sec): 0 s
Peak HR: 136 {beats}/min
Percent HR: 88 %
RPE: 19
Rest HR: 52 {beats}/min

## 2018-06-08 DIAGNOSIS — H2512 Age-related nuclear cataract, left eye: Secondary | ICD-10-CM | POA: Diagnosis not present

## 2018-06-08 DIAGNOSIS — H25011 Cortical age-related cataract, right eye: Secondary | ICD-10-CM | POA: Diagnosis not present

## 2018-06-08 DIAGNOSIS — H2511 Age-related nuclear cataract, right eye: Secondary | ICD-10-CM | POA: Diagnosis not present

## 2018-06-08 DIAGNOSIS — H25012 Cortical age-related cataract, left eye: Secondary | ICD-10-CM | POA: Diagnosis not present

## 2018-07-04 DIAGNOSIS — D510 Vitamin B12 deficiency anemia due to intrinsic factor deficiency: Secondary | ICD-10-CM | POA: Diagnosis not present

## 2018-07-20 ENCOUNTER — Ambulatory Visit (INDEPENDENT_AMBULATORY_CARE_PROVIDER_SITE_OTHER): Payer: PPO | Admitting: Cardiology

## 2018-07-20 VITALS — BP 110/76 | HR 51 | Ht 61.0 in | Wt 133.8 lb

## 2018-07-20 DIAGNOSIS — R001 Bradycardia, unspecified: Secondary | ICD-10-CM | POA: Diagnosis not present

## 2018-07-20 DIAGNOSIS — E079 Disorder of thyroid, unspecified: Secondary | ICD-10-CM | POA: Diagnosis not present

## 2018-07-20 DIAGNOSIS — I10 Essential (primary) hypertension: Secondary | ICD-10-CM | POA: Diagnosis not present

## 2018-07-20 NOTE — Progress Notes (Signed)
PCP: Harlan Stains, MD  Clinic Note: Chief Complaint  Patient presents with  . Follow-up    Test results.  No symptoms  . Bradycardia    HPI: Alexis Ochoa is a 67 y.o. female with a PMH below who is being seen today for the evaluation of BRADYCARDIA at the request of Harlan Stains, MD --> noted to have HR of 48bpm on EKG. Alexis Ochoa was initially seen on 05/03/18 for Cardiology evaluation of Bradycardia --> Ordered GXT to assess chronotropic competence & Holter Monitor to evaluate & timing severity of bradycardia  Recent Hospitalizations:   none  Studies Personally Reviewed - (if available, images/films reviewed: From Epic Chart or Care Everywhere)  GXT 06/05/18: HTN response to exercise. No EKG changes c/w ischemia. 9:00 min - 10.1 METS. Peak HR 136 bpm (8% MPHR of 154 bpm).  BP increased from 192/80 to 210/139 mHg.  [place] (05/2018): Sinus Bradycardia (35 @ ~2 AM & 7 AM) / Sinus Rhythm / Sinus Arrhythmia. Max HR 94 ( Avg 54)PVC x 1. PAC's noted. NO symptoms reported on diary. Short 2-4 beat runs of PACs  Interval History: Alexis Ochoa presents today for follow-up after her studies.  She actually really does not notice any symptoms of fatigue or dizziness or dyspnea.  She says he continues to be on the go.  She does admit that she was quite anxious when she was doing her stress test.  This was indicated by the fact that her resting blood pressure was significantly higher than it is now.  She really has not noticed exertional dyspnea.  She still does her exercise class 2 days a week, but otherwise no routine exercise. No chest pain or dyspnea with rest or exertion.  No PND, orthopnea or edema.  No syncope/near syncope or TIA/amaurosis fugax.  No sensation of exercise intolerance.  No rapid irregular heartbeats palpitations.  ROS: A comprehensive was performed. Review of Systems  Constitutional: Negative for malaise/fatigue and weight loss.  HENT: Negative for  congestion.   Respiratory: Negative for cough and wheezing.   Gastrointestinal: Negative for abdominal pain, blood in stool, heartburn and melena.  Genitourinary: Negative for hematuria.       Occasional stress incontinence  Neurological: Negative for dizziness, weakness and headaches.    I have reviewed and (if needed) personally updated the patient's problem list, medications, allergies, past medical and surgical history, social and family history.   Past Medical History:  Diagnosis Date  . History of blood transfusion 1970; 04/28/2017   "related to childbirth; related to anemia" (04/28/2017 - admitted with Pancytopenia - Dx Pernicious Anemia - Vit B12 deficiency)  . Hypertension   . Hypothyroidism    On levothyroxine  . Iron deficiency anemia 04/28/2017  . Pancytopenia ([place]) 04/28/2017  . Pernicious anemia   . Situational depression    "when husband was real sick; when adopting grandchildren"    Past Surgical History:  Procedure Laterality Date  . 48 HR HOLTER MONITOR  05/2018   Sinus Bradycardia (35 @ ~2 AM & 7 AM) / Sinus Rhythm / Sinus Arrhythmia. Max HR 94 ( Avg 54)PVC x 1. PAC's noted. NO symptoms reported on diary. Short 2-4 beat runs of PACs  . ABDOMINAL HYSTERECTOMY  ~ 1991-1992   "still have my ovaries"  . EXPLORATORY LAPAROTOMY  1980s   "before hysterectomy"  . GRADED EXERCISE TOLERANCE TEST  05/2018   HTN response to exercise. No EKG changes c/w ischemia. 9:00 min -  10.1 METS. Peak HR 136 bpm (8% MPHR of 154 bpm).  BP increased from 192/80 to 210/139 mHg.  Marland Kitchen LAPAROSCOPIC CHOLECYSTECTOMY  11/2013    Current Meds  Medication Sig  . aspirin EC 81 MG tablet Take 81 mg by mouth daily.  . cholecalciferol (VITAMIN D) 1000 UNITS tablet Take 1,000 Units by mouth at bedtime.   . cyanocobalamin 1000 MCG tablet Take 1,000 mcg by mouth every 30 (thirty) days.  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  Marland Kitchen levothyroxine  (SYNTHROID, LEVOTHROID) 100 MCG tablet Take 100 mcg by mouth at bedtime.     No Known Allergies  Social History   Tobacco Use  . Smoking status: Never Smoker  . Smokeless tobacco: Never Used  Substance Use Topics  . Alcohol use: No  . Drug use: No   Social History   Social History Narrative   She is widowed x1" 2011) remarried 2012 to her second husband Alexis Ochoa).   She has 2 daughters Alexis Ochoa, Alexis Ochoa (negative [place]).   She adopted and raised 3 granddaughters Alexis Ochoa, Alexis Ochoa and Alexis Ochoa).   She never smoked, but did have excessive secondhand smoke.  Does not drink alcohol or caffeine.   Does to exercise classes a week at the senior center (aerobics and yoga).   She currently works as Chief Executive Officer person.    family history includes Asthma in her father; Ochoa in her brother and sister; Diabetes in her brother; Heart attack in her mother; Hypertension in her brother, mother, sister, and sister; Stroke in her mother.  Wt Readings from Last 3 Encounters:  07/20/18 133 lb 12.8 oz (60.7 kg)  05/03/18 135 lb 6.4 oz (61.4 kg)  04/28/17 122 lb (55.3 kg)    PHYSICAL EXAM BP 110/76   Pulse (!) 51   Ht 5\' 1"  (1.549 m)   Wt 133 lb 12.8 oz (60.7 kg)   BMI 25.28 kg/m  Physical Exam  Constitutional: She is oriented to person, place, and time. She appears well-developed and well-nourished. No distress.  Healthy-appearing.  Well-groomed. --Notably " shaken up after just finding out that her sis-in-law just died"  HENT:  Head: Normocephalic and atraumatic.  Neck: Normal range of motion. Neck supple. No hepatojugular reflux and no JVD present. Carotid bruit is not present. No thyromegaly present.  Cardiovascular: Regular rhythm, normal heart sounds and intact distal pulses.  No extrasystoles are present. Bradycardia present. PMI is not displaced. Exam reveals no gallop and no friction rub.  No murmur heard. Pulmonary/Chest: Effort normal and breath sounds normal. No respiratory  distress. She has no wheezes. She has no rales.  Musculoskeletal: Normal range of motion. She exhibits no edema.  Neurological: She is alert and oriented to person, place, and time.  Psychiatric: She has a normal mood and affect. Her behavior is normal. Judgment and thought content normal.  SAD - stressed (see above)  Vitals reviewed.    Adult ECG Report N/A  Other studies Reviewed: Additional studies/ records that were reviewed today include:   Recent Labs: No new labs   ASSESSMENT / PLAN: Problem List Items Addressed This Visit    Essential hypertension, benign (Chronic)    She seems to have somewhat labile blood pressures.  Her pressure today is wonderful, but clearly has the ability for her blood pressure to go up during exercise.  At this point I would be reluctant to add blood pressure medicine since her blood pressure 110/76 today. However, if she  were to have Ochoa of exercise intolerance, I would consider afterload reduction with either ACE inhibitor/ARB or dihydropyridine calcium channel blocker such as amlodipine or felodipine. Avoid AV nodal active agents.      Sinus bradycardia - Primary (Chronic)    Relatively asymptomatic sinus bradycardia.  At this point she was able to achieve a heart rate of 136 which would indicate excellent chronotropic competence. No indication for pacemaker.  Her lowest heart rates tend to be at night when she was sleeping.  There is no evidence of pauses or heart block. Monitor for Ochoa and symptoms of chronotropic incompetence or syncope/near syncope.      Thyroid disease    Adequately treat hypothyroidism to avoid worsening bradycardia.         I spent a total of 35-40  minutes with the patient and chart review. >  50% of the time was spent in direct patient consultation.   Current medicines are reviewed at length with the patient today.  (+/- concerns) none The following changes have been made:  none  Patient Instructions    MEDICATION INSTRUCTIONS  NO CHANGES CURRENTLY   Your physician wants you to follow-up in [place].You will receive a reminder letter in the mail two months in advance. If you don't receive a letter, please call our office to schedule the follow-up appointment.   If you need a refill on your cardiac medications before your next appointment, please call your pharmacy.    Studies Ordered:   No orders of the defined types were placed in this encounter.     Glenetta Hew, M.D., M.S. Interventional Cardiologist   Pager # [phone] Phone # [phone] [address] [place], [place] [address]   Thank you for choosing [facility] at [facility]!!

## 2018-07-20 NOTE — Patient Instructions (Addendum)
MEDICATION INSTRUCTIONS  NO CHANGES CURRENTLY   Your physician wants you to follow-up in [place].You will receive a reminder letter in the mail two months in advance. If you don't receive a letter, please call our office to schedule the follow-up appointment.   If you need a refill on your cardiac medications before your next appointment, please call your pharmacy.

## 2018-07-23 ENCOUNTER — Encounter: Payer: Self-pay | Admitting: Cardiology

## 2018-07-23 NOTE — Assessment & Plan Note (Signed)
Adequately treat hypothyroidism to avoid worsening bradycardia.

## 2018-07-23 NOTE — Assessment & Plan Note (Signed)
She seems to have somewhat labile blood pressures.  Her pressure today is wonderful, but clearly has the ability for her blood pressure to go up during exercise.  At this point I would be reluctant to add blood pressure medicine since her blood pressure 110/76 today. However, if she were to have signs of exercise intolerance, I would consider afterload reduction with either ACE inhibitor/ARB or dihydropyridine calcium channel blocker such as amlodipine or felodipine. Avoid AV nodal active agents.

## 2018-07-23 NOTE — Assessment & Plan Note (Signed)
Relatively asymptomatic sinus bradycardia.  At this point she was able to achieve a heart rate of 136 which would indicate excellent chronotropic competence. No indication for pacemaker.  Her lowest heart rates tend to be at night when she was sleeping.  There is no evidence of pauses or heart block. Monitor for signs and symptoms of chronotropic incompetence or syncope/near syncope.

## 2018-08-01 DIAGNOSIS — D510 Vitamin B12 deficiency anemia due to intrinsic factor deficiency: Secondary | ICD-10-CM | POA: Diagnosis not present

## 2018-08-03 DIAGNOSIS — H25012 Cortical age-related cataract, left eye: Secondary | ICD-10-CM | POA: Diagnosis not present

## 2018-08-03 DIAGNOSIS — H2512 Age-related nuclear cataract, left eye: Secondary | ICD-10-CM | POA: Diagnosis not present

## 2018-09-02 DIAGNOSIS — D510 Vitamin B12 deficiency anemia due to intrinsic factor deficiency: Secondary | ICD-10-CM | POA: Diagnosis not present

## 2018-09-30 DIAGNOSIS — E538 Deficiency of other specified B group vitamins: Secondary | ICD-10-CM | POA: Diagnosis not present

## 2018-10-28 DIAGNOSIS — D510 Vitamin B12 deficiency anemia due to intrinsic factor deficiency: Secondary | ICD-10-CM | POA: Diagnosis not present

## 2018-10-28 DIAGNOSIS — E559 Vitamin D deficiency, unspecified: Secondary | ICD-10-CM | POA: Diagnosis not present

## 2018-11-29 DIAGNOSIS — D510 Vitamin B12 deficiency anemia due to intrinsic factor deficiency: Secondary | ICD-10-CM | POA: Diagnosis not present

## 2018-12-02 DIAGNOSIS — Z961 Presence of intraocular lens: Secondary | ICD-10-CM | POA: Diagnosis not present

## 2018-12-28 DIAGNOSIS — D510 Vitamin B12 deficiency anemia due to intrinsic factor deficiency: Secondary | ICD-10-CM | POA: Diagnosis not present

## 2019-01-30 DIAGNOSIS — D510 Vitamin B12 deficiency anemia due to intrinsic factor deficiency: Secondary | ICD-10-CM | POA: Diagnosis not present

## 2019-03-01 DIAGNOSIS — D510 Vitamin B12 deficiency anemia due to intrinsic factor deficiency: Secondary | ICD-10-CM | POA: Diagnosis not present

## 2019-03-07 DIAGNOSIS — Z0000 Encounter for general adult medical examination without abnormal findings: Secondary | ICD-10-CM | POA: Diagnosis not present

## 2019-03-07 DIAGNOSIS — L900 Lichen sclerosus et atrophicus: Secondary | ICD-10-CM | POA: Diagnosis not present

## 2019-03-07 DIAGNOSIS — E039 Hypothyroidism, unspecified: Secondary | ICD-10-CM | POA: Diagnosis not present

## 2019-03-07 DIAGNOSIS — E559 Vitamin D deficiency, unspecified: Secondary | ICD-10-CM | POA: Diagnosis not present

## 2019-03-07 DIAGNOSIS — D510 Vitamin B12 deficiency anemia due to intrinsic factor deficiency: Secondary | ICD-10-CM | POA: Diagnosis not present

## 2019-03-07 DIAGNOSIS — I10 Essential (primary) hypertension: Secondary | ICD-10-CM | POA: Diagnosis not present

## 2019-03-31 DIAGNOSIS — E559 Vitamin D deficiency, unspecified: Secondary | ICD-10-CM | POA: Diagnosis not present

## 2019-03-31 DIAGNOSIS — E039 Hypothyroidism, unspecified: Secondary | ICD-10-CM | POA: Diagnosis not present

## 2019-03-31 DIAGNOSIS — D510 Vitamin B12 deficiency anemia due to intrinsic factor deficiency: Secondary | ICD-10-CM | POA: Diagnosis not present

## 2019-03-31 DIAGNOSIS — I10 Essential (primary) hypertension: Secondary | ICD-10-CM | POA: Diagnosis not present

## 2019-03-31 DIAGNOSIS — Z23 Encounter for immunization: Secondary | ICD-10-CM | POA: Diagnosis not present

## 2019-05-01 DIAGNOSIS — E538 Deficiency of other specified B group vitamins: Secondary | ICD-10-CM | POA: Diagnosis not present

## 2019-06-02 DIAGNOSIS — D510 Vitamin B12 deficiency anemia due to intrinsic factor deficiency: Secondary | ICD-10-CM | POA: Diagnosis not present

## 2019-07-03 DIAGNOSIS — E538 Deficiency of other specified B group vitamins: Secondary | ICD-10-CM | POA: Diagnosis not present

## 2019-08-02 DIAGNOSIS — D510 Vitamin B12 deficiency anemia due to intrinsic factor deficiency: Secondary | ICD-10-CM | POA: Diagnosis not present

## 2019-09-04 DIAGNOSIS — D510 Vitamin B12 deficiency anemia due to intrinsic factor deficiency: Secondary | ICD-10-CM | POA: Diagnosis not present

## 2019-10-04 DIAGNOSIS — D510 Vitamin B12 deficiency anemia due to intrinsic factor deficiency: Secondary | ICD-10-CM | POA: Diagnosis not present

## 2019-11-06 DIAGNOSIS — D510 Vitamin B12 deficiency anemia due to intrinsic factor deficiency: Secondary | ICD-10-CM | POA: Diagnosis not present

## 2019-12-15 DIAGNOSIS — E538 Deficiency of other specified B group vitamins: Secondary | ICD-10-CM | POA: Diagnosis not present

## 2020-01-02 ENCOUNTER — Encounter (HOSPITAL_COMMUNITY): Payer: Self-pay | Admitting: Emergency Medicine

## 2020-01-02 ENCOUNTER — Other Ambulatory Visit: Payer: Self-pay

## 2020-01-02 ENCOUNTER — Emergency Department (HOSPITAL_COMMUNITY)
Admission: EM | Admit: 2020-01-02 | Discharge: 2020-01-03 | Disposition: A | Discharge status: Home / Self Care | Payer: PPO | Attending: Emergency Medicine | Admitting: Emergency Medicine

## 2020-01-02 DIAGNOSIS — Z5321 Procedure and treatment not carried out due to patient leaving prior to being seen by health care provider: Secondary | ICD-10-CM | POA: Diagnosis not present

## 2020-01-02 DIAGNOSIS — R1110 Vomiting, unspecified: Secondary | ICD-10-CM | POA: Insufficient documentation

## 2020-01-02 DIAGNOSIS — R197 Diarrhea, unspecified: Secondary | ICD-10-CM | POA: Diagnosis not present

## 2020-01-02 LAB — URINALYSIS, ROUTINE W REFLEX MICROSCOPIC
Bacteria, UA: NONE SEEN
Bilirubin Urine: NEGATIVE
Glucose, UA: NEGATIVE mg/dL
Hgb urine dipstick: NEGATIVE
Ketones, ur: 20 mg/dL — AB
Leukocytes,Ua: NEGATIVE
Nitrite: NEGATIVE
Protein, ur: 100 mg/dL — AB
Specific Gravity, Urine: 1.026 (ref 1.005–1.030)
pH: 5 (ref 5.0–8.0)

## 2020-01-02 LAB — CBC
HCT: 43.9 % (ref 36.0–46.0)
Hemoglobin: 15.8 g/dL — ABNORMAL HIGH (ref 12.0–15.0)
MCH: 29.6 pg (ref 26.0–34.0)
MCHC: 36 g/dL (ref 30.0–36.0)
MCV: 82.4 fL (ref 80.0–100.0)
Platelets: 256 10*3/uL (ref 150–400)
RBC: 5.33 MIL/uL — ABNORMAL HIGH (ref 3.87–5.11)
RDW: 12.7 % (ref 11.5–15.5)
WBC: 13.9 10*3/uL — ABNORMAL HIGH (ref 4.0–10.5)
nRBC: 0 % (ref 0.0–0.2)

## 2020-01-02 LAB — COMPREHENSIVE METABOLIC PANEL
ALT: 19 U/L (ref 0–44)
AST: 27 U/L (ref 15–41)
Albumin: 4.2 g/dL (ref 3.5–5.0)
Alkaline Phosphatase: 87 U/L (ref 38–126)
Anion gap: 12 (ref 5–15)
BUN: 17 mg/dL (ref 8–23)
CO2: 22 mmol/L (ref 22–32)
Calcium: 8.7 mg/dL — ABNORMAL LOW (ref 8.9–10.3)
Chloride: 96 mmol/L — ABNORMAL LOW (ref 98–111)
Creatinine, Ser: 0.92 mg/dL (ref 0.44–1.00)
GFR calc Af Amer: >60 mL/min (ref >60)
GFR calc non Af Amer: >60 mL/min (ref >60)
Glucose, Bld: 163 mg/dL — ABNORMAL HIGH (ref 70–99)
Potassium: 3 mmol/L — ABNORMAL LOW (ref 3.5–5.1)
Sodium: 130 mmol/L — ABNORMAL LOW (ref 135–145)
Total Bilirubin: 1.8 mg/dL — ABNORMAL HIGH (ref 0.3–1.2)
Total Protein: 7.2 g/dL (ref 6.5–8.1)

## 2020-01-02 LAB — LIPASE, BLOOD: Lipase: 36 U/L (ref 11–51)

## 2020-01-02 MED ORDER — SODIUM CHLORIDE 0.9% FLUSH: 3.0000 mL | Freq: Once | INTRAVENOUS | Status: DC

## 2020-01-02 NOTE — ED Triage Notes (Signed)
Pt c/o nausea, vomiting, diarrhea since 1 pm today, denies any COVID exposure, no fever, chills, change on taste or smell.

## 2020-01-02 NOTE — ED Notes (Signed)
Patient has left, will see her primary.

## 2020-01-03 DIAGNOSIS — E876 Hypokalemia: Secondary | ICD-10-CM | POA: Diagnosis not present

## 2020-01-03 DIAGNOSIS — Z1152 Encounter for screening for COVID-19: Secondary | ICD-10-CM | POA: Diagnosis not present

## 2020-01-03 DIAGNOSIS — R1110 Vomiting, unspecified: Secondary | ICD-10-CM | POA: Diagnosis not present

## 2020-01-03 DIAGNOSIS — R197 Diarrhea, unspecified: Secondary | ICD-10-CM | POA: Diagnosis not present

## 2020-01-09 DIAGNOSIS — I10 Essential (primary) hypertension: Secondary | ICD-10-CM | POA: Diagnosis not present

## 2020-01-09 DIAGNOSIS — D510 Vitamin B12 deficiency anemia due to intrinsic factor deficiency: Secondary | ICD-10-CM | POA: Diagnosis not present

## 2020-01-09 DIAGNOSIS — R109 Unspecified abdominal pain: Secondary | ICD-10-CM | POA: Diagnosis not present

## 2020-01-09 DIAGNOSIS — R197 Diarrhea, unspecified: Secondary | ICD-10-CM | POA: Diagnosis not present

## 2020-01-09 DIAGNOSIS — E876 Hypokalemia: Secondary | ICD-10-CM | POA: Diagnosis not present

## 2020-02-09 DIAGNOSIS — E538 Deficiency of other specified B group vitamins: Secondary | ICD-10-CM | POA: Diagnosis not present

## 2020-03-08 DIAGNOSIS — E039 Hypothyroidism, unspecified: Secondary | ICD-10-CM | POA: Diagnosis not present

## 2020-03-08 DIAGNOSIS — M8588 Other specified disorders of bone density and structure, other site: Secondary | ICD-10-CM | POA: Diagnosis not present

## 2020-03-08 DIAGNOSIS — Z0000 Encounter for general adult medical examination without abnormal findings: Secondary | ICD-10-CM | POA: Diagnosis not present

## 2020-03-08 DIAGNOSIS — D510 Vitamin B12 deficiency anemia due to intrinsic factor deficiency: Secondary | ICD-10-CM | POA: Diagnosis not present

## 2020-03-08 DIAGNOSIS — F3340 Major depressive disorder, recurrent, in remission, unspecified: Secondary | ICD-10-CM | POA: Diagnosis not present

## 2020-03-08 DIAGNOSIS — I10 Essential (primary) hypertension: Secondary | ICD-10-CM | POA: Diagnosis not present

## 2020-03-08 DIAGNOSIS — R21 Rash and other nonspecific skin eruption: Secondary | ICD-10-CM | POA: Diagnosis not present

## 2020-03-08 DIAGNOSIS — L900 Lichen sclerosus et atrophicus: Secondary | ICD-10-CM | POA: Diagnosis not present

## 2020-03-08 DIAGNOSIS — E559 Vitamin D deficiency, unspecified: Secondary | ICD-10-CM | POA: Diagnosis not present

## 2020-03-08 DIAGNOSIS — H6121 Impacted cerumen, right ear: Secondary | ICD-10-CM | POA: Diagnosis not present

## 2020-03-12 ENCOUNTER — Other Ambulatory Visit: Payer: Self-pay | Admitting: Family Medicine

## 2020-03-12 DIAGNOSIS — M8580 Other specified disorders of bone density and structure, unspecified site: Secondary | ICD-10-CM

## 2020-03-12 DIAGNOSIS — Z1231 Encounter for screening mammogram for malignant neoplasm of breast: Secondary | ICD-10-CM

## 2020-04-10 DIAGNOSIS — E538 Deficiency of other specified B group vitamins: Secondary | ICD-10-CM | POA: Diagnosis not present

## 2020-04-12 DIAGNOSIS — D510 Vitamin B12 deficiency anemia due to intrinsic factor deficiency: Secondary | ICD-10-CM | POA: Diagnosis not present

## 2020-04-12 DIAGNOSIS — D61818 Other pancytopenia: Secondary | ICD-10-CM | POA: Diagnosis not present

## 2020-04-12 DIAGNOSIS — I10 Essential (primary) hypertension: Secondary | ICD-10-CM | POA: Diagnosis not present

## 2020-04-12 DIAGNOSIS — E039 Hypothyroidism, unspecified: Secondary | ICD-10-CM | POA: Diagnosis not present

## 2020-04-12 DIAGNOSIS — F3340 Major depressive disorder, recurrent, in remission, unspecified: Secondary | ICD-10-CM | POA: Diagnosis not present

## 2020-05-10 DIAGNOSIS — D1801 Hemangioma of skin and subcutaneous tissue: Secondary | ICD-10-CM | POA: Diagnosis not present

## 2020-05-10 DIAGNOSIS — L821 Other seborrheic keratosis: Secondary | ICD-10-CM | POA: Diagnosis not present

## 2020-05-10 DIAGNOSIS — D2372 Other benign neoplasm of skin of left lower limb, including hip: Secondary | ICD-10-CM | POA: Diagnosis not present

## 2020-05-10 DIAGNOSIS — L80 Vitiligo: Secondary | ICD-10-CM | POA: Diagnosis not present

## 2020-05-10 DIAGNOSIS — L570 Actinic keratosis: Secondary | ICD-10-CM | POA: Diagnosis not present

## 2020-05-13 DIAGNOSIS — D510 Vitamin B12 deficiency anemia due to intrinsic factor deficiency: Secondary | ICD-10-CM | POA: Diagnosis not present

## 2020-05-31 ENCOUNTER — Ambulatory Visit
Admission: RE | Admit: 2020-05-31 | Discharge: 2020-05-31 | Disposition: A | Discharge status: Home / Self Care | Payer: PPO | Source: Ambulatory Visit | Attending: Family Medicine | Admitting: Family Medicine

## 2020-05-31 ENCOUNTER — Other Ambulatory Visit: Payer: Self-pay

## 2020-05-31 DIAGNOSIS — Z780 Asymptomatic menopausal state: Secondary | ICD-10-CM | POA: Diagnosis not present

## 2020-05-31 DIAGNOSIS — M8589 Other specified disorders of bone density and structure, multiple sites: Secondary | ICD-10-CM | POA: Diagnosis not present

## 2020-05-31 DIAGNOSIS — Z1231 Encounter for screening mammogram for malignant neoplasm of breast: Secondary | ICD-10-CM

## 2020-05-31 DIAGNOSIS — M8580 Other specified disorders of bone density and structure, unspecified site: Secondary | ICD-10-CM

## 2020-06-14 DIAGNOSIS — E538 Deficiency of other specified B group vitamins: Secondary | ICD-10-CM | POA: Diagnosis not present

## 2020-07-17 DIAGNOSIS — E538 Deficiency of other specified B group vitamins: Secondary | ICD-10-CM | POA: Diagnosis not present

## 2020-08-15 DIAGNOSIS — E538 Deficiency of other specified B group vitamins: Secondary | ICD-10-CM | POA: Diagnosis not present

## 2020-09-09 DIAGNOSIS — E039 Hypothyroidism, unspecified: Secondary | ICD-10-CM | POA: Diagnosis not present

## 2020-09-09 DIAGNOSIS — I10 Essential (primary) hypertension: Secondary | ICD-10-CM | POA: Diagnosis not present

## 2020-09-09 DIAGNOSIS — E559 Vitamin D deficiency, unspecified: Secondary | ICD-10-CM | POA: Diagnosis not present

## 2020-09-16 DIAGNOSIS — E538 Deficiency of other specified B group vitamins: Secondary | ICD-10-CM | POA: Diagnosis not present

## 2020-10-16 DIAGNOSIS — E538 Deficiency of other specified B group vitamins: Secondary | ICD-10-CM | POA: Diagnosis not present

## 2020-11-15 DIAGNOSIS — E538 Deficiency of other specified B group vitamins: Secondary | ICD-10-CM | POA: Diagnosis not present

## 2020-12-16 DIAGNOSIS — E538 Deficiency of other specified B group vitamins: Secondary | ICD-10-CM | POA: Diagnosis not present

## 2021-01-15 DIAGNOSIS — E538 Deficiency of other specified B group vitamins: Secondary | ICD-10-CM | POA: Diagnosis not present

## 2021-02-14 DIAGNOSIS — E538 Deficiency of other specified B group vitamins: Secondary | ICD-10-CM | POA: Diagnosis not present

## 2021-03-13 DIAGNOSIS — E559 Vitamin D deficiency, unspecified: Secondary | ICD-10-CM | POA: Diagnosis not present

## 2021-03-13 DIAGNOSIS — L989 Disorder of the skin and subcutaneous tissue, unspecified: Secondary | ICD-10-CM | POA: Diagnosis not present

## 2021-03-13 DIAGNOSIS — I10 Essential (primary) hypertension: Secondary | ICD-10-CM | POA: Diagnosis not present

## 2021-03-13 DIAGNOSIS — F3340 Major depressive disorder, recurrent, in remission, unspecified: Secondary | ICD-10-CM | POA: Diagnosis not present

## 2021-03-13 DIAGNOSIS — Z0000 Encounter for general adult medical examination without abnormal findings: Secondary | ICD-10-CM | POA: Diagnosis not present

## 2021-03-13 DIAGNOSIS — E039 Hypothyroidism, unspecified: Secondary | ICD-10-CM | POA: Diagnosis not present

## 2021-03-13 DIAGNOSIS — L900 Lichen sclerosus et atrophicus: Secondary | ICD-10-CM | POA: Diagnosis not present

## 2021-03-13 DIAGNOSIS — E538 Deficiency of other specified B group vitamins: Secondary | ICD-10-CM | POA: Diagnosis not present

## 2021-03-13 DIAGNOSIS — Z1159 Encounter for screening for other viral diseases: Secondary | ICD-10-CM | POA: Diagnosis not present

## 2021-04-02 DIAGNOSIS — L900 Lichen sclerosus et atrophicus: Secondary | ICD-10-CM | POA: Diagnosis not present

## 2021-04-14 DIAGNOSIS — I10 Essential (primary) hypertension: Secondary | ICD-10-CM | POA: Diagnosis not present

## 2021-04-14 DIAGNOSIS — E538 Deficiency of other specified B group vitamins: Secondary | ICD-10-CM | POA: Diagnosis not present

## 2021-04-14 DIAGNOSIS — E039 Hypothyroidism, unspecified: Secondary | ICD-10-CM | POA: Diagnosis not present

## 2021-05-07 DIAGNOSIS — L900 Lichen sclerosus et atrophicus: Secondary | ICD-10-CM | POA: Diagnosis not present

## 2021-05-14 DIAGNOSIS — E538 Deficiency of other specified B group vitamins: Secondary | ICD-10-CM | POA: Diagnosis not present

## 2021-06-13 DIAGNOSIS — E538 Deficiency of other specified B group vitamins: Secondary | ICD-10-CM | POA: Diagnosis not present

## 2021-07-14 DIAGNOSIS — E538 Deficiency of other specified B group vitamins: Secondary | ICD-10-CM | POA: Diagnosis not present

## 2021-08-13 DIAGNOSIS — E538 Deficiency of other specified B group vitamins: Secondary | ICD-10-CM | POA: Diagnosis not present

## 2021-09-15 DIAGNOSIS — E039 Hypothyroidism, unspecified: Secondary | ICD-10-CM | POA: Diagnosis not present

## 2021-09-15 DIAGNOSIS — B349 Viral infection, unspecified: Secondary | ICD-10-CM | POA: Diagnosis not present

## 2021-09-15 DIAGNOSIS — E538 Deficiency of other specified B group vitamins: Secondary | ICD-10-CM | POA: Diagnosis not present

## 2021-09-15 DIAGNOSIS — R0981 Nasal congestion: Secondary | ICD-10-CM | POA: Diagnosis not present

## 2021-09-15 DIAGNOSIS — I10 Essential (primary) hypertension: Secondary | ICD-10-CM | POA: Diagnosis not present

## 2021-09-15 DIAGNOSIS — U071 COVID-19: Secondary | ICD-10-CM | POA: Diagnosis not present

## 2021-09-15 DIAGNOSIS — E559 Vitamin D deficiency, unspecified: Secondary | ICD-10-CM | POA: Diagnosis not present

## 2021-09-15 DIAGNOSIS — R5383 Other fatigue: Secondary | ICD-10-CM | POA: Diagnosis not present

## 2021-09-15 DIAGNOSIS — R52 Pain, unspecified: Secondary | ICD-10-CM | POA: Diagnosis not present

## 2021-10-15 DIAGNOSIS — E039 Hypothyroidism, unspecified: Secondary | ICD-10-CM | POA: Diagnosis not present

## 2021-10-15 DIAGNOSIS — E538 Deficiency of other specified B group vitamins: Secondary | ICD-10-CM | POA: Diagnosis not present

## 2021-10-15 DIAGNOSIS — I10 Essential (primary) hypertension: Secondary | ICD-10-CM | POA: Diagnosis not present

## 2021-11-12 DIAGNOSIS — E538 Deficiency of other specified B group vitamins: Secondary | ICD-10-CM | POA: Diagnosis not present

## 2021-12-12 DIAGNOSIS — E538 Deficiency of other specified B group vitamins: Secondary | ICD-10-CM | POA: Diagnosis not present

## 2022-01-09 DIAGNOSIS — E538 Deficiency of other specified B group vitamins: Secondary | ICD-10-CM | POA: Diagnosis not present

## 2022-02-04 DIAGNOSIS — H6981 Other specified disorders of Eustachian tube, right ear: Secondary | ICD-10-CM | POA: Diagnosis not present

## 2022-02-11 DIAGNOSIS — E039 Hypothyroidism, unspecified: Secondary | ICD-10-CM | POA: Diagnosis not present

## 2022-02-11 DIAGNOSIS — E559 Vitamin D deficiency, unspecified: Secondary | ICD-10-CM | POA: Diagnosis not present

## 2022-02-11 DIAGNOSIS — E538 Deficiency of other specified B group vitamins: Secondary | ICD-10-CM | POA: Diagnosis not present

## 2022-02-11 DIAGNOSIS — I10 Essential (primary) hypertension: Secondary | ICD-10-CM | POA: Diagnosis not present

## 2022-02-24 ENCOUNTER — Other Ambulatory Visit (HOSPITAL_COMMUNITY): Payer: Self-pay

## 2022-02-24 ENCOUNTER — Other Ambulatory Visit: Payer: Self-pay

## 2022-02-24 DIAGNOSIS — H00015 Hordeolum externum left lower eyelid: Secondary | ICD-10-CM | POA: Diagnosis not present

## 2022-02-24 MED ORDER — OFLOXACIN 0.3 % OP SOLN
OPHTHALMIC | 0 refills | Status: DC
  Filled 2022-02-24: qty 5, 7d supply, fill #0
  Filled 2022-02-24: qty 5, 20d supply, fill #0

## 2022-03-13 DIAGNOSIS — E538 Deficiency of other specified B group vitamins: Secondary | ICD-10-CM | POA: Diagnosis not present

## 2022-03-25 DIAGNOSIS — Z0000 Encounter for general adult medical examination without abnormal findings: Secondary | ICD-10-CM | POA: Diagnosis not present

## 2022-03-25 DIAGNOSIS — I10 Essential (primary) hypertension: Secondary | ICD-10-CM | POA: Diagnosis not present

## 2022-03-25 DIAGNOSIS — N393 Stress incontinence (female) (male): Secondary | ICD-10-CM | POA: Diagnosis not present

## 2022-03-25 DIAGNOSIS — L900 Lichen sclerosus et atrophicus: Secondary | ICD-10-CM | POA: Diagnosis not present

## 2022-03-25 DIAGNOSIS — E559 Vitamin D deficiency, unspecified: Secondary | ICD-10-CM | POA: Diagnosis not present

## 2022-03-25 DIAGNOSIS — Z23 Encounter for immunization: Secondary | ICD-10-CM | POA: Diagnosis not present

## 2022-03-25 DIAGNOSIS — E538 Deficiency of other specified B group vitamins: Secondary | ICD-10-CM | POA: Diagnosis not present

## 2022-03-25 DIAGNOSIS — E039 Hypothyroidism, unspecified: Secondary | ICD-10-CM | POA: Diagnosis not present

## 2022-04-02 DIAGNOSIS — H938X1 Other specified disorders of right ear: Secondary | ICD-10-CM | POA: Diagnosis not present

## 2022-04-02 DIAGNOSIS — H6981 Other specified disorders of Eustachian tube, right ear: Secondary | ICD-10-CM | POA: Diagnosis not present

## 2022-04-13 DIAGNOSIS — E538 Deficiency of other specified B group vitamins: Secondary | ICD-10-CM | POA: Diagnosis not present

## 2022-04-13 DIAGNOSIS — E559 Vitamin D deficiency, unspecified: Secondary | ICD-10-CM | POA: Diagnosis not present

## 2022-05-13 DIAGNOSIS — I10 Essential (primary) hypertension: Secondary | ICD-10-CM | POA: Diagnosis not present

## 2022-05-13 DIAGNOSIS — E538 Deficiency of other specified B group vitamins: Secondary | ICD-10-CM | POA: Diagnosis not present

## 2022-05-13 DIAGNOSIS — E039 Hypothyroidism, unspecified: Secondary | ICD-10-CM | POA: Diagnosis not present

## 2022-06-12 DIAGNOSIS — E538 Deficiency of other specified B group vitamins: Secondary | ICD-10-CM | POA: Diagnosis not present

## 2022-07-10 DIAGNOSIS — E538 Deficiency of other specified B group vitamins: Secondary | ICD-10-CM | POA: Diagnosis not present

## 2022-08-12 DIAGNOSIS — E538 Deficiency of other specified B group vitamins: Secondary | ICD-10-CM | POA: Diagnosis not present

## 2022-09-14 DIAGNOSIS — E538 Deficiency of other specified B group vitamins: Secondary | ICD-10-CM | POA: Diagnosis not present

## 2022-09-14 DIAGNOSIS — R5383 Other fatigue: Secondary | ICD-10-CM | POA: Diagnosis not present

## 2022-09-14 DIAGNOSIS — R001 Bradycardia, unspecified: Secondary | ICD-10-CM | POA: Diagnosis not present

## 2022-09-14 DIAGNOSIS — I10 Essential (primary) hypertension: Secondary | ICD-10-CM | POA: Diagnosis not present

## 2022-09-14 DIAGNOSIS — E039 Hypothyroidism, unspecified: Secondary | ICD-10-CM | POA: Diagnosis not present

## 2022-09-18 DIAGNOSIS — I480 Paroxysmal atrial fibrillation: Secondary | ICD-10-CM

## 2022-09-18 HISTORY — DX: Paroxysmal atrial fibrillation: I48.0

## 2022-09-25 ENCOUNTER — Encounter (HOSPITAL_BASED_OUTPATIENT_CLINIC_OR_DEPARTMENT_OTHER): Payer: Self-pay | Admitting: *Deleted

## 2022-09-25 ENCOUNTER — Other Ambulatory Visit: Payer: Self-pay

## 2022-09-25 ENCOUNTER — Emergency Department (HOSPITAL_BASED_OUTPATIENT_CLINIC_OR_DEPARTMENT_OTHER)
Admission: EM | Admit: 2022-09-25 | Discharge: 2022-09-25 | Disposition: A | Discharge status: Home / Self Care | Payer: PPO | Attending: Emergency Medicine | Admitting: Emergency Medicine

## 2022-09-25 DIAGNOSIS — S6991XA Unspecified injury of right wrist, hand and finger(s), initial encounter: Secondary | ICD-10-CM | POA: Diagnosis present

## 2022-09-25 DIAGNOSIS — W268XXA Contact with other sharp object(s), not elsewhere classified, initial encounter: Secondary | ICD-10-CM | POA: Diagnosis not present

## 2022-09-25 DIAGNOSIS — S61411A Laceration without foreign body of right hand, initial encounter: Secondary | ICD-10-CM | POA: Diagnosis not present

## 2022-09-25 DIAGNOSIS — Z7982 Long term (current) use of aspirin: Secondary | ICD-10-CM | POA: Diagnosis not present

## 2022-09-25 DIAGNOSIS — I10 Essential (primary) hypertension: Secondary | ICD-10-CM | POA: Insufficient documentation

## 2022-09-25 MED ORDER — BACITRACIN ZINC 500 UNIT/GM EX OINT
TOPICAL_OINTMENT | Freq: Once | CUTANEOUS | Status: AC
  Filled 2022-09-25: qty 28.35

## 2022-09-25 MED ORDER — LIDOCAINE HCL (PF) 1 % IJ SOLN
5.0000 mL | Freq: Once | INTRAMUSCULAR | Status: AC
  Administered 2022-09-25: 5 mL
  Filled 2022-09-25: qty 5

## 2022-09-25 NOTE — ED Notes (Signed)
R hand irrigated with sterile water.

## 2022-09-25 NOTE — ED Triage Notes (Signed)
Pt is here for laceration to right hand from a dresser.  Dressing in place, bleeding controlled.  Last tetanus shot was in June 2023

## 2022-09-25 NOTE — ED Provider Notes (Signed)
[facility] EMERGENCY DEPT Provider Note   CSN: [id number] Arrival date & time: 09/25/22  1403     History  Chief Complaint  Patient presents with   Laceration    Alexis Ochoa is a 71 y.o. female.  With past medical history of hypertension, pancytopenia who presents to the emergency department with laceration.  Patient states that she was moving a dresser today when it slipped, slicing her right hand.  Denies anticoagulation use.  Last tetanus was June 2023.   Laceration      Home Medications Prior to Admission medications   Medication Sig Start Date End Date Taking? Authorizing Provider  aspirin EC 81 MG tablet Take 81 mg by mouth daily.    [provider]  cholecalciferol (VITAMIN D) 1000 UNITS tablet Take 1,000 Units by mouth at bedtime.     [provider]  cyanocobalamin 1000 MCG tablet Take 1,000 mcg by mouth every 30 (thirty) days.    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    [provider]  levothyroxine (SYNTHROID, LEVOTHROID) 100 MCG tablet Take 100 mcg by mouth at bedtime.     [provider]  ofloxacin (OCUFLOX) 0.3 % ophthalmic solution Instill 1 drop into affected eye four times a day for 7 days 02/24/22         Allergies    Patient has no known allergies.    Review of Systems   Review of Systems  Skin:  Positive for wound.  All other systems reviewed and are negative.   Physical Exam Updated Vital Signs BP (!) 165/72   Pulse (!) 50   Temp 97.8 F (36.6 C) (Oral)   Resp 18   SpO2 100%  Physical Exam Vitals and nursing note reviewed.  HENT:     Head: Normocephalic and atraumatic.  Eyes:     General: No scleral icterus. Pulmonary:     Effort: Pulmonary effort is normal. No respiratory distress.  Musculoskeletal:        General: Normal range of motion.  Skin:    General: Skin is warm and dry.     Capillary  Refill: Capillary refill takes less than 2 seconds.     Findings: Laceration present. No rash.     Comments: 2 to 2-1/2 cm laceration to the volar surface of the right hand over the first digit MCP.  There is another small less than 1 cm semicircular laceration at the base of the right thumb.  Neurological:     General: No focal deficit present.     Mental Status: She is alert and oriented to person, place, and time. Mental status is at baseline.  Psychiatric:        Mood and Affect: Mood normal.        Behavior: Behavior normal.        Thought Content: Thought content normal.        Judgment: Judgment normal.     ED Results / Procedures / Treatments   Labs (all labs ordered are listed, but only abnormal results are displayed) Labs Reviewed - No data to display  EKG None  Radiology No results found.  Procedures .Marland KitchenLaceration Repair  Date/Time: 09/25/2022 3:15 PM  Performed by: Mickie Hillier, PA-C Authorized by: Mickie Hillier, PA-C   Consent:    Consent obtained:  Verbal   Consent given by:  Patient   Risks, benefits, and alternatives were discussed: yes  Risks discussed:  Infection, pain, need for additional repair, poor cosmetic result and poor wound healing   Alternatives discussed:  No treatment Universal protocol:    Procedure explained and questions answered to patient or proxy's satisfaction: yes     Relevant documents present and verified: yes     Test results available: yes     Imaging studies available: yes     Required blood products, implants, devices, and special equipment available: yes     Site/side marked: yes     Immediately prior to procedure, a time out was called: yes     Patient identity confirmed:  Verbally with patient Anesthesia:    Anesthesia method:  Local infiltration   Local anesthetic:  Lidocaine 1% w/o epi Laceration details:    Location:  Hand   Hand location:  R palm   Length (cm):  2   Depth (mm):  2 Pre-procedure details:     Preparation:  Patient was prepped and draped in usual sterile fashion Exploration:    Limited defect created (wound extended): no     Hemostasis achieved with:  Direct pressure   Wound exploration: wound explored through full range of motion and entire depth of wound visualized     Wound extent: areolar tissue violated     Contaminated: no   Treatment:    Area cleansed with:  Povidone-iodine and saline   Amount of cleaning:  Standard   Irrigation solution:  Sterile saline   Irrigation volume:  500   Irrigation method:  Pressure wash and tap Skin repair:    Repair method:  Sutures and tissue adhesive   Suture size:  6-0   Suture material:  Prolene   Suture technique:  Simple interrupted   Number of sutures:  3 Approximation:    Approximation:  Close Repair type:    Repair type:  Simple Post-procedure details:    Dressing:  Antibiotic ointment and non-adherent dressing   Procedure completion:  Tolerated well, no immediate complications    Medications Ordered in ED Medications  lidocaine (PF) (XYLOCAINE) 1 % injection 5 mL (5 mLs Infiltration Given 09/25/22 1431)  bacitracin ointment ( Topical Given 09/25/22 1524)    ED Course/ Medical Decision Making/ A&P                           Medical Decision Making Risk OTC drugs. Prescription drug management.  Initial Impression and Ddx 71 year old female who presents to the emergency department with laceration.  On my exam she has 2 lacerations as noted above in the physical exam.  Mechanism is not concerning for underlying fracture.  I am able to visualize the bottom of the lacerations and would not obtain plain film for evaluation of foreign body.  She is up-to-date on her tetanus.  Not anticoagulated. Patient PMH that increases complexity of ED encounter: Hypertension, pancytopenia  Interpretation of Diagnostics I independent reviewed and interpreted the labs as followed: Not indicated  - I independently visualized the  following imaging with scope of interpretation limited to determining acute life threatening conditions related to emergency care: Not indicated  Patient Reassessment and Ultimate Disposition/Management Wounds have been closed.  Hemostatic.  Antibiotic ointment and dressing has been applied.  Instructed to return in 7 days for removal of sutures or sooner if there is any concern for infection.  There was no indication on physical exam of underlying tendinous or ligamentous injury.  The patient has been appropriately medically  screened and/or stabilized in the ED. I have low suspicion for any other emergent medical condition which would require further screening, evaluation or treatment in the ED or require inpatient management. At time of discharge the patient is hemodynamically stable and in no acute distress. I have discussed work-up results and diagnosis with patient and answered all questions. Patient is agreeable with discharge plan. We discussed strict return precautions for returning to the emergency department and they verbalized understanding.    Patient management required discussion with the following services or consulting groups:  None  Complexity of Problems Addressed Acute complicated illness or Injury  Additional Data Reviewed and Analyzed Further history obtained from: Care Everywhere and Prior labs/imaging results  Patient Encounter Risk Assessment Minor Procedures  Final Clinical Impression(s) / ED Diagnoses Final diagnoses:  Laceration of right hand without foreign body, initial encounter    Rx / DC Orders ED Discharge Orders     None         Mickie Hillier, PA-C 09/26/22 [id number]    Varney Biles, MD 09/26/22 2217

## 2022-09-25 NOTE — Discharge Instructions (Signed)
You were seen in the emergency department today for a laceration to your hand.  Please return in 7 days to remove your sutures or sooner if your hand appears infection. The glue we placed on your thumb laceration will fall off in about 2 weeks. I have attached instructions on how to care for your laceration.

## 2022-09-25 NOTE — ED Notes (Signed)
Discharge instructions, suture removal, and wound care reviewed and explained, pt verbalized understanding and had no further questions on d/c. Pt caox4 and ambulatory.

## 2022-09-28 ENCOUNTER — Ambulatory Visit: Payer: PPO | Attending: Cardiology

## 2022-09-28 ENCOUNTER — Ambulatory Visit: Payer: PPO | Attending: Cardiology | Admitting: Cardiology

## 2022-09-28 ENCOUNTER — Encounter: Payer: Self-pay | Admitting: Cardiology

## 2022-09-28 VITALS — BP 140/68 | HR 48 | Ht 61.0 in | Wt 133.6 lb

## 2022-09-28 DIAGNOSIS — R55 Syncope and collapse: Secondary | ICD-10-CM | POA: Diagnosis not present

## 2022-09-28 DIAGNOSIS — R001 Bradycardia, unspecified: Secondary | ICD-10-CM

## 2022-09-28 DIAGNOSIS — I10 Essential (primary) hypertension: Secondary | ICD-10-CM

## 2022-09-28 NOTE — Patient Instructions (Addendum)
Medication Instructions:    Take valsartan- HCTZ  at dinnertime  instead of bedtime.   *If you need a refill on your cardiac medications before your next appointment, please call your pharmacy*   Lab Work: Not needed If you have labs (blood work) drawn today and your tests are completely normal, you will receive your results only by: [facility] (if you have MyChart) OR A paper copy in the mail If you have any lab test that is abnormal or we need to change your treatment, we will call you to review the results.   Testing/Procedures:  Will be mailed to 3 to 7 days Your physician has recommended that you wear a holter monitor 14 days [employer]. Holter monitors are medical devices that record the heart's electrical activity. Doctors most often use these monitors to diagnose arrhythmias. Arrhythmias are problems with the speed or rhythm of the heartbeat. The monitor is a small, portable device. You can wear one while you do your normal daily activities. This is usually used to diagnose what is causing palpitations/syncope (passing out).   Follow-Up: At [facility], you and your health needs are our priority.  As part of our continuing mission to provide you with exceptional heart care, we have created designated Provider Care Teams.  These Care Teams include your primary Cardiologist (physician) and Advanced Practice Providers (APPs -  Physician Assistants and Nurse Practitioners) who all work together to provide you with the care you need, when you need it.     Your next appointment:   6 to 8 week(s)  The format for your next appointment:   In Person  Provider:   Glenetta Hew, MD    Other Instructions   Take a supplement for electrolytes- if you feel poorly - - ( like  IV liquid  or  chicken broth  something similar to Gatorade)   ZIO XT- Long Term Monitor Instructions  Your physician has requested you wear a ZIO patch monitor for 14 days.  This is a single patch monitor.  [employer] supplies one patch monitor per enrollment. Additional stickers are not available. Please do not apply patch if you will be having a Nuclear Stress Test,  Echocardiogram, Cardiac CT, MRI, or Chest Xray during the period you would be wearing the  monitor. The patch cannot be worn during these tests. You cannot remove and re-apply the  ZIO XT patch monitor.  Your ZIO patch monitor will be mailed 3 day USPS to your address on file. It may take 3-5 days  to receive your monitor after you have been enrolled.  Once you have received your monitor, please review the enclosed instructions. Your monitor  has already been registered assigning a specific monitor serial # to you.  Billing and Patient Assistance Program Information  We have supplied [employer] with any of your insurance information on file for billing purposes. [employer] offers a sliding scale Patient Assistance Program for patients that do not have  insurance, or whose insurance does not completely cover the cost of the ZIO monitor.  You must apply for the Patient Assistance Program to qualify for this discounted rate.  To apply, please call [employer] at [phone], select option 4, select option 2, ask to apply for  Patient Assistance Program. Theodore Demark will ask your household income, and how many people  are in your household. They will quote your out-of-pocket cost based on that information.  [employer] will also be able to set up a 76-month interest-free payment plan if  needed.  Applying the monitor   Shave hair from upper left chest.  Hold abrader disc by orange tab. Rub abrader in 40 strokes over the upper left chest as  indicated in your monitor instructions.  Clean area with 4 enclosed alcohol pads. Let dry.  Apply patch as indicated in monitor instructions. Patch will be placed under collarbone on left  side of chest with arrow pointing upward.  Rub patch adhesive wings for 2 minutes. Remove white label marked "1". Remove the  white  label marked "2". Rub patch adhesive wings for 2 additional minutes.  While looking in a mirror, press and release button in center of patch. A small green light will  flash 3-4 times. This will be your only indicator that the monitor has been turned on.  Do not shower for the first 24 hours. You may shower after the first 24 hours.  Press the button if you feel a symptom. You will hear a small click. Record Date, Time and  Symptom in the Patient Logbook.  When you are ready to remove the patch, follow instructions on the last 2 pages of Patient  Logbook. Stick patch monitor onto the last page of Patient Logbook.  Place Patient Logbook in the blue and white box. Use locking tab on box and tape box closed  securely. The blue and white box has prepaid postage on it. Please place it in the mailbox as  soon as possible. Your physician should have your test results approximately 7 days after the  monitor has been mailed back to [facility].  Call [facility] at [phone] if you have questions regarding  your ZIO XT patch monitor. Call them immediately if you see an orange light blinking on your  monitor.  If your monitor falls off in less than 4 days, contact our Monitor department at [phone].  If your monitor becomes loose or falls off after 4 days call [employer] at [phone] for  suggestions on securing your monitor

## 2022-09-28 NOTE — Progress Notes (Unsigned)
Enrolled for [employer] to mail a ZIO XT long term holter monitor to the patients address on file.  

## 2022-09-28 NOTE — Progress Notes (Signed)
Primary Care Provider: Harlan Stains, MD [place] Cardiologist: Glenetta Hew, MD Electrophysiologist: None  Clinic Note: Chief Complaint  Patient presents with   New Patient (Initial Visit)    Reevaluation for bradycardia, now with syncope.   ===================================  ASSESSMENT/PLAN   Problem List Items Addressed This Visit       Cardiology Problems   Sinus bradycardia (Chronic)    Previously evaluated and found to have normal heart rate response to just exercise-no current incompetence.  Unlikely that with longstanding bradycardia although send she would have an episode of syncope.  It is possible that her resting bradycardia would make her more susceptible to vasovagal syncope which is partly what I think this is.  Plan: Check 7-day Zio patch monitor to evaluate heart response.  For her to have true syncope I would expect her to have a pause or significant bradycardia.      Relevant Medications   valsartan-hydrochlorothiazide (DIOVAN-HCT) 320-12.5 MG tablet   Essential hypertension, benign (Chronic)    ICC is a little hypertensive today which is acceptable in the setting of recent syncope.  Would not be more aggressive with treating.      Relevant Medications   valsartan-hydrochlorothiazide (DIOVAN-HCT) 320-12.5 MG tablet   Other Relevant Orders   EKG 12-Lead (Completed)   LONG TERM MONITOR (3-14 DAYS)     Other   Syncope and collapse - Primary    This episode seems to be most consistent with orthostatic vasovagal type syncope.  She felt lightheaded and flushed and had not really been feeling well for couple days beforehand with some nausea etc.  It is possible that she just got lipid nauseated and with the vagal tone she could have had vasovagal mediated profound bradycardia in the setting of baseline bradycardia and this could have led to syncope.  At this point I think we need to assess with event monitor.  We do not know if she has had  pauses or severe bradycardia.   She has not had any heart failure symptoms, chest pain or signs of symptoms of palpitations therefore I do not think echocardiogram or stress test are necessary. No carotid bruit and I do not suspect that she would all of a sudden had a syncopal episode from vertebral stenosis.  Therefore carotid Dopplers were not necessary.  Recommend aggressive hydration to ensure adequate blood pressure control.  Try to get up to about 80 ounces of water a day.  This also needs to be associated with eating, or using supple such as a liquid IV etc.  Consider support stockings when on her feet for long period time.  Take her time getting up standing up.  I explained to her that we oftentimes never figure out what happened in the situations, the all we can do is make DES.  My suspicion is that she did have vasovagal symptoms on top of already low blood pressures.  As such, would like to leave that her blood pressures stay somewhat elevated in the range where it is right now.  Would also with low threshold to discontinue HCTZ and simply continue the ACE inhibitor.      Relevant Orders   EKG 12-Lead (Completed)   LONG TERM MONITOR (3-14 DAYS)    ===================================  HPI:    VICTORINA KABLE is a 71 y.o. female with PMH notable for HTN, hypothyroidism (on Synthroid) who is being seen today for the evaluation of BRADYCARDIA and SYNCOPE (and Fatigue)-echo at the request of White,  Caren Griffins, MD.  I saw Ms. White in October 2019 for follow-up evaluation of bradycardia.  We evaluated her with a 48-hour Holter monitor and a GXT (results noted in [facility])..  Relatively asymptomatic bradycardia with no signs of Kardie Tobb incompetence.  No indication for PPM.  Had not any syncope.  Was exercising 2 days a week. => No further evaluation warranted.  [name] was seen by Harlan Stains, MD on November 27 for routine follow-up.  She indicated there is an episode on October 9  when she had a syncopal episode at home.  She was up and about doing things (had been up standing for 10 to 15 minutes).  She began to feel lightheaded and dizzy-felt as though maybe she needed to eat something and before she knew what she was on the floor.  She must of been out for least a minute.  Immediately check her blood pressure when she came to.  Blood pressure 108/66 6 with heart rate 72.  The only thing of that she noted was that she was a bit worn out after taking her grandkids to school on 23 October and that day when she got home her blood pressure was down to 80/50.  Recent Hospitalizations: None  Reviewed  CV studies:    The following studies were reviewed today: (if available, images/films reviewed: From Epic Chart or Care Everywhere) See PSH:  Interval History:   MYCAH MCDOUGALL presents today for reevaluation after the episode that was noted above.  She reiterated the story of having a syncopal episode while up and about doing chores in the house.  She has been doing something active for maybe 10 to 15 minutes-not having discomfort sitting and standing.  She felt flushed lightheaded and dizzy and before she knew it she blacked out and fell the floor.  She denied any palpitations denied any irregular heartbeats.  She denied any nausea or vomiting just flushed and dizzy.  She did hit her head arm and bottom of the table so she was quite bruised up, but did not have any other injuries.  In retrospect she notes that maybe the Thursday before this episode happened (which was a Saturday) she had had nausea and vomiting after caring for the grandkids.  She get little bit sick and then had a similar symptom the day before this episode.  Says she had not necessarily been eating and drinking the way she he normally would.  She also brought the for blood pressure being low after taking her kids to school 1 day.  She gets up very early and gets them off to school and then comes back.  That  morning she also was dizzy and woozy but did not pass out.  Blood pressure was low.  She has not had any other cardiac symptoms of chest pain pressure or dyspnea with rest or exertion.  No PND, orthopnea or edema.  No sensation of palpitations or irregular heartbeats.  No TIA or amaurosis fugax symptoms.  No claudication.   At baseline she does try to drink 60 to 80 ounces of water a day but had not been doing so for the last few days prior to this episode because of not feeling well.  REVIEWED OF SYSTEMS   Review of Systems  Constitutional:  Positive for malaise/fatigue (She was feeling worn out that day). Negative for weight loss.  HENT:  Negative for nosebleeds.   Respiratory: Negative.  Negative for cough.   Gastrointestinal:  Negative  for abdominal pain and blood in stool.  Genitourinary:  Negative for frequency, hematuria and urgency.  Musculoskeletal:        Only the bruising that she had from her fall-that is all recovered now.  Neurological:  Positive for dizziness (Much better) and loss of consciousness (1 episode noted in HPI.  Other near syncopal episode noted as well.). Negative for focal weakness.  Psychiatric/Behavioral: Negative.  Negative for depression. The patient is not nervous/anxious.     I have reviewed and (if needed) personally updated the patient's problem list, medications, allergies, past medical and surgical history, social and family history.   PAST MEDICAL HISTORY   Past Medical History:  Diagnosis Date   History of blood transfusion 1970; 04/28/2017   "related to childbirth; related to anemia" (04/28/2017 - admitted with Pancytopenia - Dx Pernicious Anemia - Vit B12 deficiency)   Hypertension    Hypothyroidism    On levothyroxine   Iron deficiency anemia 04/28/2017   Pancytopenia ([place]) 04/28/2017   Pernicious anemia    Situational depression    "when husband was real sick; when adopting grandchildren"   Stress incontinence; carpal tunnel; vitamin D  deficiency Family history of colon cancer (first-degree relative)  PAST SURGICAL HISTORY   Past Surgical History:  Procedure Laterality Date   48 HR HOLTER MONITOR  05/2018   Sinus Bradycardia (35 @ ~2 AM & 7 AM) / Sinus Rhythm / Sinus Arrhythmia. Max HR 94 ( Avg 54)PVC x 1. PAC's noted. NO symptoms reported on diary. Short 2-4 beat runs of PACs   ABDOMINAL HYSTERECTOMY  ~ 1991-1992   "still have my ovaries"   [place]   "before hysterectomy"   GRADED EXERCISE TOLERANCE TEST  05/2018   HTN response to exercise. No EKG changes c/w ischemia. 9:00 min - 10.1 METS. Peak HR 136 bpm (8% MPHR of 154 bpm).  BP increased from 192/80 to 210/139 mHg.   LAPAROSCOPIC CHOLECYSTECTOMY  11/2013     There is no immunization history on file for this patient.  MEDICATIONS/ALLERGIES   Current Meds  Medication Sig   Ascorbic Acid (VITAMIN C WITH ROSE HIPS) 500 MG tablet Take 500 mg by mouth daily.   aspirin EC 81 MG tablet Take 81 mg by mouth daily.   Calcium-Magnesium-Zinc 334-134-5 MG TABS Take by mouth.   cholecalciferol (VITAMIN D) 1000 UNITS tablet Take 1,000 Units by mouth at bedtime.    cyanocobalamin (VITAMIN B12) 1000 MCG/ML injection Inject 1,000 mcg into the muscle every 30 (thirty) days.   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   levothyroxine (SYNTHROID) 88 MCG tablet Take 88 mcg by mouth.   valsartan-hydrochlorothiazide (DIOVAN-HCT) 320-12.5 MG tablet Take 1 tablet by mouth daily.    No Known Allergies  SOCIAL HISTORY/FAMILY HISTORY   Reviewed in Epic:   Social History   Tobacco Use   Smoking status: Never   Smokeless tobacco: Never  Vaping Use   Vaping Use: Never used  Substance Use Topics   Alcohol use: No   Drug use: No   Social History   Social History Narrative   She is widowed x1" 2011) remarried 2012 to her second husband Ruthann Cancer).   She has 2 daughters Santiago Glad, Angelyn (negative  Tuttle).   She adopted and raised 3 granddaughters [place], Gregary Signs and [place]).   She never smoked, but did have excessive secondhand smoke.  Does not drink alcohol or caffeine.   Does to exercise classes  a [facility] at the senior center (aerobics and yoga).   She currently works as Chief Executive Officer person.   Family History  Problem Relation Age of Onset   Hypertension Mother    Heart attack Mother    Stroke Mother    Asthma Father    Cancer Sister    Hypertension Sister    Cancer Brother    Diabetes Brother    Hypertension Brother    Hypertension Sister    Family History includes Asthma in her father; Cancer in her brother and sister; Diabetes in her brother; Heart attack in her mother; Hypertension in her brother, mother, sister, and sister; Stroke in her mother.    OBJCTIVE -PE, EKG, labs   Wt Readings from Last 3 Encounters:  09/28/22 133 lb 9.6 oz (60.6 kg)  01/02/20 140 lb (63.5 kg)  07/20/18 133 lb 12.8 oz (60.7 kg)    Physical Exam: BP (!) 140/68 (BP Location: Left Arm, Patient Position: Sitting, Cuff Size: Normal)   Pulse (!) 48   Ht '5\' 1"'$  (1.549 m)   Wt 133 lb 9.6 oz (60.6 kg)   SpO2 99%   BMI 25.24 kg/m  Physical Exam Constitutional:      General: She is not in acute distress.    Appearance: Normal appearance. She is normal weight. She is not ill-appearing or toxic-appearing.  HENT:     Head: Normocephalic and atraumatic.  Neck:     Vascular: No carotid bruit.  Cardiovascular:     Rate and Rhythm: Normal rate and regular rhythm.     Pulses: Normal pulses.     Heart sounds: Normal heart sounds. No murmur heard.    No friction rub. No gallop.  Pulmonary:     Effort: Pulmonary effort is normal. No respiratory distress.     Breath sounds: Normal breath sounds. No wheezing or rhonchi.  Musculoskeletal:     Cervical back: Normal range of motion and neck supple.  Skin:    General: Skin is warm and dry.  Neurological:     General: No focal deficit present.      Mental Status: She is alert and oriented to person, place, and time.     Gait: Gait abnormal.  Psychiatric:        Mood and Affect: Mood normal.        Behavior: Behavior normal.        Thought Content: Thought content normal.        Judgment: Judgment normal.    Adult ECG Report  Rate: 48 ;  Rhythm: sinus bradycardia, sinus arrhythmia, and RBBB.  Computer reads septal MI, age-indeterminate.  Not present. ;  Normal intervals and durations.  Normal voltage.  Narrative Interpretation: Stable  Recent Labs: Reviewed No results found for: "CHOL", "HDL", "LDLCALC", "LDLDIRECT", "TRIG", "CHOLHDL" Lab Results  Component Value Date   CREATININE 0.92 01/02/2020   BUN 17 01/02/2020   NA 130 (L) 01/02/2020   K 3.0 (L) 01/02/2020   CL 96 (L) 01/02/2020   CO2 22 01/02/2020      Latest Ref Rng & Units 01/02/2020    9:41 PM 04/29/2017    4:52 AM 04/28/2017    2:05 PM  CBC  WBC 4.0 - 10.5 K/uL 13.9  3.2  2.3   Hemoglobin 12.0 - 15.0 g/dL 15.8  9.4  6.9   Hematocrit 36.0 - 46.0 % 43.9  26.9  18.7   Platelets 150 - 400 K/uL 256  96  125  No results found for: "HGBA1C" Lab Results  Component Value Date   TSH 0.112 (L) 04/28/2017    ================================================== I spent a total of 23 minutes with the patient spent in direct patient consultation.  Additional time spent with chart review  / charting (studies, outside notes, etc): 25 min Total Time: 48 min  Current medicines are reviewed at length with the patient today.  (+/- concerns) none  Notice: This dictation was prepared with Dragon dictation along with smart phrase technology. Any transcriptional errors that result from this process are unintentional and may not be corrected upon review.   Studies Ordered:  Orders Placed This Encounter  Procedures   LONG TERM MONITOR (3-14 DAYS)   EKG 12-Lead   No orders of the defined types were placed in this encounter.   Patient Instructions / Medication Changes &  Studies & Tests Ordered   Patient Instructions  Medication Instructions:    Take valsartan- HCTZ  at dinnertime  instead of bedtime.   *If you need a refill on your cardiac medications before your next appointment, please call your pharmacy*   Lab Work: Not needed If you have labs (blood work) drawn today and your tests are completely normal, you will receive your results only by: [place] (if you have MyChart) OR A paper copy in the mail If you have any lab test that is abnormal or we need to change your treatment, we will call you to review the results.   Testing/Procedures:  Will be mailed to 3 to 7 days Your physician has recommended that you wear a holter monitor 14 days Zio. Holter monitors are medical devices that record the heart's electrical activity. Doctors most often use these monitors to diagnose arrhythmias. Arrhythmias are problems with the speed or rhythm of the heartbeat. The monitor is a small, portable device. You can wear one while you do your normal daily activities. This is usually used to diagnose what is causing palpitations/syncope (passing out).   Follow-Up: At [facility], you and your health needs are our priority.  As part of our continuing mission to provide you with exceptional heart care, we have created designated Provider Care Teams.  These Care Teams include your primary Cardiologist (physician) and Advanced Practice Providers (APPs -  Physician Assistants and Nurse Practitioners) who all work together to provide you with the care you need, when you need it.     Your next appointment:   6 to 8 week(s)  The format for your next appointment:   In Person  Provider:   Glenetta Hew, MD    Other Instructions   Take a supplement for electrolytes- if you feel poorly - - ( like  IV liquid  or  chicken broth  something similar to Gatorade)   ZIO XT- Long Term Monitor Instructions  Your physician has requested you wear a ZIO patch monitor  for 14 days.    Leonie Man, MD, MS Glenetta Hew, M.D., M.S. Interventional Cardiologist  [place]  Pager # [phone] Phone # [phone] [address] [place], [place] [address]   Thank you for choosing [facility]!!

## 2022-09-29 DIAGNOSIS — I10 Essential (primary) hypertension: Secondary | ICD-10-CM

## 2022-09-29 DIAGNOSIS — R55 Syncope and collapse: Secondary | ICD-10-CM | POA: Diagnosis not present

## 2022-10-03 ENCOUNTER — Emergency Department (HOSPITAL_BASED_OUTPATIENT_CLINIC_OR_DEPARTMENT_OTHER)
Admission: EM | Admit: 2022-10-03 | Discharge: 2022-10-03 | Disposition: A | Discharge status: Home / Self Care | Payer: PPO | Attending: Emergency Medicine | Admitting: Emergency Medicine

## 2022-10-03 ENCOUNTER — Encounter (HOSPITAL_BASED_OUTPATIENT_CLINIC_OR_DEPARTMENT_OTHER): Payer: Self-pay | Admitting: Emergency Medicine

## 2022-10-03 ENCOUNTER — Other Ambulatory Visit: Payer: Self-pay

## 2022-10-03 ENCOUNTER — Encounter: Payer: Self-pay | Admitting: Cardiology

## 2022-10-03 DIAGNOSIS — S61411D Laceration without foreign body of right hand, subsequent encounter: Secondary | ICD-10-CM | POA: Insufficient documentation

## 2022-10-03 DIAGNOSIS — X58XXXD Exposure to other specified factors, subsequent encounter: Secondary | ICD-10-CM | POA: Insufficient documentation

## 2022-10-03 DIAGNOSIS — Z4802 Encounter for removal of sutures: Secondary | ICD-10-CM | POA: Diagnosis not present

## 2022-10-03 DIAGNOSIS — Z7982 Long term (current) use of aspirin: Secondary | ICD-10-CM | POA: Insufficient documentation

## 2022-10-03 NOTE — ED Notes (Signed)
3 stiches removed from pts right hand

## 2022-10-03 NOTE — Assessment & Plan Note (Signed)
This episode seems to be most consistent with orthostatic vasovagal type syncope.  She felt lightheaded and flushed and had not really been feeling well for couple days beforehand with some nausea etc.  It is possible that she just got lipid nauseated and with the vagal tone she could have had vasovagal mediated profound bradycardia in the setting of baseline bradycardia and this could have led to syncope.  At this point I think we need to assess with event monitor.  We do not know if she has had pauses or severe bradycardia.   She has not had any heart failure symptoms, chest pain or signs of symptoms of palpitations therefore I do not think echocardiogram or stress test are necessary. No carotid bruit and I do not suspect that she would all of a sudden had a syncopal episode from vertebral stenosis.  Therefore carotid Dopplers were not necessary.  Recommend aggressive hydration to ensure adequate blood pressure control.  Try to get up to about 80 ounces of water a day.  This also needs to be associated with eating, or using supple such as a liquid IV etc.  Consider support stockings when on her feet for long period time.  Take her time getting up standing up.  I explained to her that we oftentimes never figure out what happened in the situations, the all we can do is make DES.  My suspicion is that she did have vasovagal symptoms on top of already low blood pressures.  As such, would like to leave that her blood pressures stay somewhat elevated in the range where it is right now.  Would also with low threshold to discontinue HCTZ and simply continue the ACE inhibitor.

## 2022-10-03 NOTE — ED Triage Notes (Signed)
Pt arrives to ED to have stitches removed.

## 2022-10-03 NOTE — Assessment & Plan Note (Signed)
Previously evaluated and found to have normal heart rate response to just exercise-no current incompetence.  Unlikely that with longstanding bradycardia although send she would have an episode of syncope.  It is possible that her resting bradycardia would make her more susceptible to vasovagal syncope which is partly what I think this is.  Plan: Check 7-day Zio patch monitor to evaluate heart response.  For her to have true syncope I would expect her to have a pause or significant bradycardia.

## 2022-10-03 NOTE — ED Provider Notes (Signed)
[place] [facility] EMERGENCY DEPT Provider Note   CSN: [id number] Arrival date & time: 10/03/22  1126     History  Chief Complaint  Patient presents with   Suture / Staple Removal    Alexis Ochoa is a 71 y.o. female.  Patient presents to the emergency department today for suture removal.  She had sutures placed in her right hand approximately 1 week ago.  She feels that the areas have been healing well.  No drainage or worsening pain.  She has been protecting the area.  No difficulty with flexion and extension of her index or middle finger.       Home Medications Prior to Admission medications   Medication Sig Start Date End Date Taking? Authorizing Provider  Ascorbic Acid (VITAMIN C WITH ROSE HIPS) 500 MG tablet Take 500 mg by mouth daily.    [provider]  aspirin EC 81 MG tablet Take 81 mg by mouth daily.    [provider]  Calcium-Magnesium-Zinc [phone] MG TABS Take by mouth.    [provider]  cholecalciferol (VITAMIN D) 1000 UNITS tablet Take 1,000 Units by mouth at bedtime.     [provider]  cyanocobalamin (VITAMIN B12) 1000 MCG/ML injection Inject 1,000 mcg into the muscle every 30 (thirty) days. 05/06/17   [provider]  cyanocobalamin 1000 MCG tablet Take 1,000 mcg by mouth every 30 (thirty) days.    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    [provider]  levothyroxine (SYNTHROID) 88 MCG tablet Take 88 mcg by mouth. 03/22/22   [provider]  levothyroxine (SYNTHROID, LEVOTHROID) 100 MCG tablet Take 100 mcg by mouth at bedtime.     [provider]  ofloxacin (OCUFLOX) 0.3 % ophthalmic solution Instill 1 drop into affected eye four times a day for 7 days 02/24/22     valsartan-hydrochlorothiazide (DIOVAN-HCT) 320-12.5 MG tablet Take 1 tablet by mouth daily. 02/11/22   [provider]       Allergies    Patient has no known allergies.    Review of Systems   Review of Systems  Physical Exam Updated Vital Signs BP 136/66 (BP Location: Left Arm)   Pulse (!) 41   Temp (!) 97.3 F (36.3 C)   Resp 18   SpO2 100%  Physical Exam Vitals and nursing note reviewed.  Constitutional:      Appearance: She is well-developed.  HENT:     Head: Normocephalic and atraumatic.  Eyes:     Conjunctiva/sclera: Conjunctivae normal.  Pulmonary:     Effort: No respiratory distress.  Musculoskeletal:     Cervical back: Normal range of motion and neck supple.  Skin:    General: Skin is warm and dry.     Comments: Right hand: 2 healing wounds, 1 linear laceration with sutures removed.  No surrounding tenderness.  Also 1 V-shaped laceration which was glued and also appears to be healing well.  No signs of infection.  Patient is able to fully flex and extend the fingers of her right hand.  Neurological:     Mental Status: She is alert.     ED Results / Procedures / Treatments   Labs (all labs ordered are listed, but only abnormal results are displayed) Labs Reviewed - No data to display  EKG None  Radiology No results found.  Procedures Procedures    Medications Ordered in ED Medications - No data  to display  ED Course/ Medical Decision Making/ A&P    Patient seen and examined.  Sutures removed by RN.  Most recent vital signs reviewed and are as follows: BP 136/66 (BP Location: Left Arm)   Pulse (!) 41   Temp (!) 97.3 F (36.3 C)   Resp 18   SpO2 100%   Initial impression: Encounter for suture removal, healing well, no complications.  Return instructions discussed with patient: Pt urged to return with worsening pain, worsening swelling, expanding area of redness or streaking up extremity, fever, or any other concerns. Pt verbalizes understanding and agrees with plan.  Follow-up instructions discussed with patient: Follow-up with PCP as needed.                           Medical Decision Making  Patient here for suture removal, appears to be healing well without complications.        Final Clinical Impression(s) / ED Diagnoses Final diagnoses:  Visit for suture removal    Rx / DC Orders ED Discharge Orders     None         Carlisle Cater, PA-C 10/03/22 1229    Charlesetta Shanks, MD 10/05/22 1343

## 2022-10-03 NOTE — Assessment & Plan Note (Signed)
ICC is a little hypertensive today which is acceptable in the setting of recent syncope.  Would not be more aggressive with treating.

## 2022-10-03 NOTE — ED Notes (Signed)
Patient verbalizes understanding of discharge instructions. Opportunity for questioning and answers were provided. Patient discharged from ED.  °

## 2022-10-14 DIAGNOSIS — E538 Deficiency of other specified B group vitamins: Secondary | ICD-10-CM | POA: Diagnosis not present

## 2022-10-21 DIAGNOSIS — R55 Syncope and collapse: Secondary | ICD-10-CM | POA: Diagnosis not present

## 2022-10-21 DIAGNOSIS — I10 Essential (primary) hypertension: Secondary | ICD-10-CM | POA: Diagnosis not present

## 2022-11-13 DIAGNOSIS — E538 Deficiency of other specified B group vitamins: Secondary | ICD-10-CM | POA: Diagnosis not present

## 2022-12-01 ENCOUNTER — Encounter: Payer: Self-pay | Admitting: Cardiology

## 2022-12-01 ENCOUNTER — Ambulatory Visit: Payer: PPO | Attending: Cardiology | Admitting: Cardiology

## 2022-12-01 VITALS — BP 146/64 | HR 51 | Ht 60.0 in | Wt 131.2 lb

## 2022-12-01 DIAGNOSIS — I10 Essential (primary) hypertension: Secondary | ICD-10-CM

## 2022-12-01 DIAGNOSIS — I4891 Unspecified atrial fibrillation: Secondary | ICD-10-CM | POA: Diagnosis not present

## 2022-12-01 DIAGNOSIS — I495 Sick sinus syndrome: Secondary | ICD-10-CM | POA: Diagnosis not present

## 2022-12-01 DIAGNOSIS — R001 Bradycardia, unspecified: Secondary | ICD-10-CM

## 2022-12-01 DIAGNOSIS — D6869 Other thrombophilia: Secondary | ICD-10-CM

## 2022-12-01 DIAGNOSIS — R55 Syncope and collapse: Secondary | ICD-10-CM

## 2022-12-01 DIAGNOSIS — I480 Paroxysmal atrial fibrillation: Secondary | ICD-10-CM | POA: Insufficient documentation

## 2022-12-01 MED ORDER — APIXABAN 5 MG PO TABS: 5.0000 mg | ORAL_TABLET | Freq: Two times a day (BID) | ORAL | 2 refills | Status: DC

## 2022-12-01 NOTE — Progress Notes (Signed)
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   Difficult to manage rate versus rhythm control although she is pretty much rate controlled in sinus rhythm, was very fast in A-fib.  Thankfully, not overly symptomatic, but she did have a recent syncopal episode which could have been exacerbated by bradycardia or pauses.  Plan: EP consult      Relevant Medications   apixaban (ELIQUIS) 5 MG TABS tablet   Other Relevant Orders   Ambulatory referral to Cardiac Electrophysiology   ECHOCARDIOGRAM COMPLETE   Sinus bradycardia (Chronic)   Relevant Medications   apixaban (ELIQUIS) 5 MG TABS tablet   Other Relevant Orders   Ambulatory referral to Cardiac Electrophysiology   ECHOCARDIOGRAM COMPLETE   New onset a-fib (HCC) - Primary    New diagnosis of PAF going quite fast although this was with activity, the fact that she was able to go 220 bpm in A-fib without chest pain with suggest that she does not have CAD.  She basically passed her stress test.   Plan:  Check 2D Echo Would not use beta-blocker or AV nodal agent because of her resting bradycardia and recent syncopal episode. Concern for baseline underlying tachybradycardia with pretty slow bradycardia on no AV nodal agents and fast tachycardic A-fib => will refer to EP to consider either a A-fib ablation versus and potentially PPM.  Initiate DOAC      Relevant Medications   apixaban (ELIQUIS) 5 MG TABS tablet   Other Relevant Orders   Ambulatory referral to Cardiac  Electrophysiology   ECHOCARDIOGRAM COMPLETE   Hypercoagulable state due to paroxysmal atrial fibrillation ([place]): CHA2DS2-VASc Score 4 (age-80, HTN, female, aortic calcification) (Chronic)    Will start Eliquis 5 mg twice daily.  She is due to have multiple teeth removed soon.  Based on concerns for oral bleeding, we will be a little more judicious and allow her to hold Eliquis for 3 days preprocedure and 2 days post  Otherwise 2 to 3 days holding Eliquis for most procedures is reasonable.      Relevant Medications   apixaban (ELIQUIS) 5 MG TABS tablet   Essential hypertension, benign (Chronic)    BP is little high today, but is not usually based on her prior studies.  Whether syncopal episode and orthostatic symptoms I will be reluctant to be overly aggressive. Plan: Continue valsartan and HCTZ with low threshold to actually convert to valsartan alone.      Relevant Medications   apixaban (ELIQUIS) 5 MG TABS tablet     Other   Syncope and collapse (Chronic)    I do not think that A-fib alone would be the reason for her syncope, however a post A-fib pause could potentially be responsible.      Relevant Orders   Ambulatory referral to Cardiac Electrophysiology   ECHOCARDIOGRAM COMPLETE   ===================================  HPI:    Alexis Ochoa is a 72 y.o. female with PMH notable for HTN, hypothyroidism (on Synthroid) who is being seen today for follow-up evaluation of BRADYCARDIA and SYNCOPE (and Fatigue) to discuss  results of at the request of event monitor.   Alexis Ochoa returns today at the request Harlan Stains, MD, who saw her back in November for evaluation of a syncopal episode on October 19.  She was up and about doing things (had been up standing for 10 to 15 minutes).  She began to feel lightheaded and dizzy-felt as though maybe she needed to eat something and before she knew what she was on the floor.  She must of been out for least a minute.  Immediately check  her blood pressure when she came to.  Blood pressure 108/66 6 with heart rate 72.  The only thing of that she noted was that she was a bit worn out after taking her grandkids to school on 23 October and that day when she got home her blood pressure was down to 80/50. = referred for CV evaluation,  Alexis Ochoa was seen for reevaluation on July 29, 2022 => we felt that syncopal episode was most likely consistent with orthostatic vasovagal type syncope and that it began with her feeling lightheaded and flushed and having had poor p.o. intake for several days.  However, with her baseline bradycardia, we did recommend event monitor.  Also discussed low threshold to discontinue HCTZ to avoid orthostatic hypotension.  At baseline she does try to drink 60 to 80 ounces of water a day but had not been doing so for the last few days prior to this episode because of not feeling well.  She denied any other cardiac symptoms of chest pain pressure or dyspnea at rest or exertion.  No irregular heartbeats palpitations.  No PND, orthopnea or edema.  No recurrent syncopal episodes.  No TIA or amaurosis fugax.  Recent Hospitalizations: None  Reviewed  CV studies:    The following studies were reviewed today: (if available, images/films reviewed: From Epic Chart or Care Everywhere) 14-day Zio patch monitor: Predominantly Sinus Rhythm (HR range 37-132 bpm, avg 55 bpm).  1% Afib - HR range 87-220 bpm (avg 130 bpm) -longest episode 3hr 13 min - avg hr 130 bpm; longest episode was followed by secondary 9:33 min run.  Frequent PACs (7.4%), with rare PACs.  No prolonged bradycardia or pauses.  A-fib not noticed.  Interval History:   Alexis Ochoa returns here today to discuss results of her monitor results.  We discussed findings on her monitor and she did not on 18 December she was very stressed out.  She had a rough night taking her dog to the veterinarian and eventually "putting it down".  The episode of tachycardia  is actually associated with when she was taking a hole to bury her dog.  She was very stressed out, anxious and upset.  She did feel her heart rate going fast but did not necessarily feel lightheaded or dizzy.  She denied any chest pain or pressure.  She has not had any further episodes of syncope or near syncope.  No PND orthopnea or edema.  No TIA or amaurosis fugax.  No claudication.  No melena or hematochezia, hematuria, or epistaxis.  She maintains her high level of activity, she is very good about staying hydrated.  She did mention that she is due to have some teeth removed in a few weeks.  REVIEWED OF SYSTEMS   Review of Systems  Constitutional:  Negative for malaise/fatigue (She was feeling worn out that day after Afib - but had been up most the night) and weight loss.  HENT:  Negative for nosebleeds.   Respiratory: Negative.  Negative for cough.   Cardiovascular:  Positive for palpitations (Did notice that her heart was beating fast he was taking the hole.).  Gastrointestinal:  Negative for abdominal pain and blood in stool.  Genitourinary:  Negative for frequency, hematuria and urgency.  Musculoskeletal:        Only the bruising that she had from her fall-that is all recovered now.  Neurological:  Negative for dizziness (Much better), focal weakness and loss of consciousness (No further syncopal episodes).  Psychiatric/Behavioral: Negative.  Negative for depression. The patient is not nervous/anxious.     I have reviewed and (if needed) personally updated the patient's problem list, medications, allergies, past medical and surgical history, social and family history.   PAST MEDICAL HISTORY   Past Medical History:  Diagnosis Date   History of blood transfusion 1970; 04/28/2017   "related to childbirth; related to anemia" (04/28/2017 - admitted with Pancytopenia - Dx Pernicious Anemia - Vit B12 deficiency)   Hypertension    Hypothyroidism    On levothyroxine   Iron deficiency  anemia 04/28/2017   PAF (paroxysmal atrial fibrillation) ([place]) [id number]   Complicated by sinus bradycardia   Pancytopenia ([place]) 04/28/2017   Pernicious anemia    Situational depression    "when husband was real sick; when adopting grandchildren"   Stress incontinence; carpal tunnel; vitamin D deficiency Family history of colon cancer (first-degree relative)  PAST SURGICAL HISTORY   Past Surgical History:  Procedure Laterality Date   48 HR HOLTER MONITOR  05/2018   Sinus Bradycardia (35 @ ~2 AM & 7 AM) / Sinus Rhythm / Sinus Arrhythmia. Max HR 94 ( Avg 54)PVC x 1. PAC's noted. NO symptoms reported on diary. Short 2-4 beat runs of PACs   ABDOMINAL HYSTERECTOMY  ~ 1991-1992   "still have my ovaries"   [place]   "before hysterectomy"   GRADED EXERCISE TOLERANCE TEST  05/2018   HTN response to exercise. No EKG changes c/w ischemia. 9:00 min - 10.1 METS. Peak HR 136 bpm (8% MPHR of 154 bpm).  BP increased from 192/80 to 210/139 mHg.   LAPAROSCOPIC CHOLECYSTECTOMY  11/2013    MEDICATIONS/ALLERGIES   Current Meds  Medication Sig   Ascorbic Acid (VITAMIN C WITH ROSE HIPS) 500 MG tablet Take 500 mg by mouth daily.   Calcium-Magnesium-Zinc 334-134-5 MG TABS Take by mouth.   cholecalciferol (VITAMIN D) 1000 UNITS tablet Take 1,000 Units by mouth at bedtime.    cyanocobalamin (VITAMIN B12) 1000 MCG/ML injection Inject 1,000 mcg into the muscle every 30 (thirty) days.   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   levothyroxine (SYNTHROID) 88 MCG tablet Take 88 mcg by mouth.   valsartan-hydrochlorothiazide (DIOVAN-HCT) 320-12.5 MG tablet Take 1 tablet by mouth daily.   []$  aspirin EC 81 MG tablet Take 81 mg by mouth daily.   No Known Allergies  SOCIAL HISTORY/FAMILY HISTORY   Reviewed in Epic:   Social History   Tobacco Use   Smoking status: Never   Smokeless tobacco: Never  Vaping Use   Vaping Use: Never  used  Substance Use Topics   Alcohol use: No   Drug use: No   Social History   Social History Narrative   She is widowed x1" 2011) remarried 2012 to her second husband Ruthann Cancer).   She has 2 daughters Santiago Glad, Angelyn (negative Logan).   She adopted and raised 3 granddaughters Roda, Gregary Signs  and [place]).   She never smoked, but did have excessive secondhand smoke.  Does not drink alcohol or caffeine.   Does to exercise classes a week at the senior center (aerobics and yoga).   She currently works as Chief Executive Officer person.   Family History  Problem Relation Age of Onset   Hypertension Mother    Heart attack Mother    Stroke Mother    Asthma Father    Cancer Sister    Hypertension Sister    Cancer Brother    Diabetes Brother    Hypertension Brother    Hypertension Sister    Family History includes Asthma in her father; Cancer in her brother and sister; Diabetes in her brother; Heart attack in her mother; Hypertension in her brother, mother, sister, and sister; Stroke in her mother.    OBJCTIVE -PE, EKG, labs   Wt Readings from Last 3 Encounters:  12/01/22 131 lb 3.2 oz (59.5 kg)  09/28/22 133 lb 9.6 oz (60.6 kg)  01/02/20 140 lb (63.5 kg)    Physical Exam: BP (!) 146/64   Pulse (!) 51   Ht 5' (1.524 m)   Wt 131 lb 3.2 oz (59.5 kg)   SpO2 98%   BMI 25.62 kg/m  Physical Exam Constitutional:      General: She is not in acute distress.    Appearance: Normal appearance. She is normal weight. She is not ill-appearing or toxic-appearing.  HENT:     Head: Normocephalic and atraumatic.  Eyes:     Extraocular Movements: Extraocular movements intact.     Pupils: Pupils are equal, round, and reactive to light.  Neck:     Vascular: No carotid bruit.  Cardiovascular:     Rate and Rhythm: Normal rate and regular rhythm.     Pulses: Normal pulses.     Heart sounds: Normal heart sounds. No murmur heard.    No friction rub. No gallop.  Pulmonary:     Effort: Pulmonary  effort is normal. No respiratory distress.     Breath sounds: Normal breath sounds. No wheezing or rhonchi.  Musculoskeletal:        General: No swelling. Normal range of motion.     Cervical back: Normal range of motion and neck supple.  Skin:    General: Skin is warm and dry.  Neurological:     General: No focal deficit present.     Mental Status: She is alert and oriented to person, place, and time.  Psychiatric:        Mood and Affect: Mood normal.        Behavior: Behavior normal.        Thought Content: Thought content normal.        Judgment: Judgment normal.    Adult ECG Report Not checked  Recent Labs: Reviewed 09/14/2022: TC 168, TG 73, HDL 58.  (LDL not reported-02/11/2022 was 93); Hgb 13.1 05/13/2022: Cr 0.82, K+ 3.8.  TSH 2.15.  ================================================== I spent a total of 45 minutes with the patient spent in direct patient consultation.  Additional time spent with chart review  / charting (studies, outside notes, etc): 22 min Total Time: 67 min  Current medicines are reviewed at length with the patient today.  (+/- concerns) none  Notice: This dictation was prepared with Dragon dictation along with smart phrase technology. Any transcriptional errors that result from this process are unintentional and may not be corrected upon review.   Studies Ordered:  Orders Placed This Encounter  Procedures   Ambulatory referral to Cardiac Electrophysiology   ECHOCARDIOGRAM COMPLETE   Meds ordered this encounter  Medications   apixaban (ELIQUIS) 5 MG TABS tablet    Sig: Take 1 tablet (5 mg total) by mouth 2 (two) times daily.    Dispense:  180 tablet    Refill:  2    Patient Instructions / Medication Changes & Studies & Tests Ordered   Patient Instructions  Medication Instructions:   Start taking Eliquis  5 mg one tablet twice a day  *If you need a refill on your cardiac medications before your next appointment, please call your  pharmacy*   Lab Work:  Not  needed   Testing/Procedures:  Will be schedule at [place] has requested that you have an echocardiogram. Echocardiography is a painless test that uses sound waves to create images of your heart. It provides your doctor with information about the size and shape of your heart and how well your heart's chambers and valves are working. This procedure takes approximately one hour. There are no restrictions for this procedure. Please do NOT wear cologne, perfume, aftershave, or lotions (deodorant is allowed). Please arrive 15 minutes prior to your appointment time.   Follow-Up: At [facility], you and your health needs are our priority.  As part of our continuing mission to provide you with exceptional heart care, we have created designated Provider Care Teams.  These Care Teams include your primary Cardiologist (physician) and Advanced Practice Providers (APPs -  Physician Assistants and Nurse Practitioners) who all work together to provide you with the care you need, when you need it.     Your next appointment:   4 month(s)  The format for your next appointment:   In Person  Provider:   Glenetta Hew, MD   You have been referred to Electrophysiologist  Dr Myles Gip or Dr Curt Bears or   Dr Quentin Ore   [address] Other Instructions      Leonie Man, MD, MS Glenetta Hew, M.D., M.S. Interventional Cardiologist  [place]  Pager # [phone] Phone # [phone] [address] [place], [place] [address]   Thank you for choosing [facility] at [place]!!

## 2022-12-01 NOTE — Patient Instructions (Signed)
Medication Instructions:   Start taking Eliquis  5 mg one tablet twice a day  *If you need a refill on your cardiac medications before your next appointment, please call your pharmacy*   Lab Work:  Not  needed   Testing/Procedures:  Will be schedule at [place] has requested that you have an echocardiogram. Echocardiography is a painless test that uses sound waves to create images of your heart. It provides your doctor with information about the size and shape of your heart and how well your heart's chambers and valves are working. This procedure takes approximately one hour. There are no restrictions for this procedure. Please do NOT wear cologne, perfume, aftershave, or lotions (deodorant is allowed). Please arrive 15 minutes prior to your appointment time.   Follow-Up: At [facility], you and your health needs are our priority.  As part of our continuing mission to provide you with exceptional heart care, we have created designated Provider Care Teams.  These Care Teams include your primary Cardiologist (physician) and Advanced Practice Providers (APPs -  Physician Assistants and Nurse Practitioners) who all work together to provide you with the care you need, when you need it.     Your next appointment:   4 month(s)  The format for your next appointment:   In Person  Provider:   Glenetta Hew, MD   You have been referred to Electrophysiologist  Dr Myles Gip or Dr Curt Bears or   Dr Quentin Ore   [address] Other Instructions

## 2022-12-06 ENCOUNTER — Encounter: Payer: Self-pay | Admitting: Cardiology

## 2022-12-06 DIAGNOSIS — I480 Paroxysmal atrial fibrillation: Secondary | ICD-10-CM | POA: Insufficient documentation

## 2022-12-06 NOTE — Assessment & Plan Note (Signed)
I do not think that A-fib alone would be the reason for her syncope, however a post A-fib pause could potentially be responsible.

## 2022-12-06 NOTE — Assessment & Plan Note (Signed)
BP is little high today, but is not usually based on her prior studies.  Whether syncopal episode and orthostatic symptoms I will be reluctant to be overly aggressive. Plan: Continue valsartan and HCTZ with low threshold to actually convert to valsartan alone.

## 2022-12-06 NOTE — Assessment & Plan Note (Signed)
New diagnosis of PAF going quite fast although this was with activity, the fact that she was able to go 220 bpm in A-fib without chest pain with suggest that she does not have CAD.  She basically passed her stress test.   Plan:  Check 2D Echo Would not use beta-blocker or AV nodal agent because of her resting bradycardia and recent syncopal episode. Concern for baseline underlying tachybradycardia with pretty slow bradycardia on no AV nodal agents and fast tachycardic A-fib => will refer to EP to consider either a A-fib ablation versus and potentially PPM.  Initiate DOAC

## 2022-12-06 NOTE — Assessment & Plan Note (Signed)
Clearly has tachycardic episodes as well as baseline bradycardia.   Difficult to manage rate versus rhythm control although she is pretty much rate controlled in sinus rhythm, was very fast in A-fib.  Thankfully, not overly symptomatic, but she did have a recent syncopal episode which could have been exacerbated by bradycardia or pauses.  Plan: EP consult

## 2022-12-06 NOTE — Assessment & Plan Note (Signed)
Will start Eliquis 5 mg twice daily.  She is due to have multiple teeth removed soon.  Based on concerns for oral bleeding, we will be a little more judicious and allow her to hold Eliquis for 3 days preprocedure and 2 days post  Otherwise 2 to 3 days holding Eliquis for most procedures is reasonable.

## 2022-12-11 DIAGNOSIS — E538 Deficiency of other specified B group vitamins: Secondary | ICD-10-CM | POA: Diagnosis not present

## 2022-12-17 ENCOUNTER — Encounter: Payer: Self-pay | Admitting: Cardiology

## 2022-12-18 NOTE — Telephone Encounter (Signed)
That's fine.  Easier - so as not to potentially "forget to hold".    [place]

## 2022-12-25 ENCOUNTER — Ambulatory Visit (HOSPITAL_COMMUNITY): Payer: PPO | Attending: Cardiology

## 2022-12-25 DIAGNOSIS — I4891 Unspecified atrial fibrillation: Secondary | ICD-10-CM | POA: Insufficient documentation

## 2022-12-25 DIAGNOSIS — I495 Sick sinus syndrome: Secondary | ICD-10-CM | POA: Insufficient documentation

## 2022-12-25 DIAGNOSIS — R55 Syncope and collapse: Secondary | ICD-10-CM | POA: Diagnosis not present

## 2022-12-25 DIAGNOSIS — R001 Bradycardia, unspecified: Secondary | ICD-10-CM | POA: Diagnosis not present

## 2022-12-25 DIAGNOSIS — R9431 Abnormal electrocardiogram [ECG] [EKG]: Secondary | ICD-10-CM | POA: Diagnosis not present

## 2022-12-25 LAB — ECHOCARDIOGRAM COMPLETE
Area-P 1/2: 4.04 cm2
P 1/2 time: 806 msec
S' Lateral: 2.6 cm

## 2022-12-28 DIAGNOSIS — K068 Other specified disorders of gingiva and edentulous alveolar ridge: Secondary | ICD-10-CM | POA: Insufficient documentation

## 2022-12-28 DIAGNOSIS — Z7901 Long term (current) use of anticoagulants: Secondary | ICD-10-CM | POA: Diagnosis not present

## 2022-12-29 ENCOUNTER — Encounter (HOSPITAL_BASED_OUTPATIENT_CLINIC_OR_DEPARTMENT_OTHER): Payer: Self-pay | Admitting: *Deleted

## 2022-12-29 ENCOUNTER — Other Ambulatory Visit: Payer: Self-pay

## 2022-12-29 ENCOUNTER — Emergency Department (HOSPITAL_BASED_OUTPATIENT_CLINIC_OR_DEPARTMENT_OTHER)
Admission: EM | Admit: 2022-12-29 | Discharge: 2022-12-29 | Disposition: A | Discharge status: Home / Self Care | Payer: PPO | Attending: Emergency Medicine | Admitting: Emergency Medicine

## 2022-12-29 DIAGNOSIS — K068 Other specified disorders of gingiva and edentulous alveolar ridge: Secondary | ICD-10-CM

## 2022-12-29 NOTE — ED Triage Notes (Signed)
Pt is on Elaquis and had 11 teeth removed last week.  Pt has been bleeding since 3pm (Monday 3/11).  Pt had her new dentures in place and DDS advised to remove dentures and use gauze to stop bleeding.  Pt continues to ooze blood.  Last dose of elaquis was yesterday (Monday morning).  No weakness, pt was not feeling well today

## 2022-12-29 NOTE — ED Provider Notes (Signed)
[facility]  Provider Note  CSN: [id number] Arrival date & time: 12/28/22 2340  History Chief Complaint  Patient presents with   Bleeding/Bruising    Alexis Ochoa is a 72 y.o. female with history of afib on eliquis had 11 teeth extracted about a week ago. She was doing well post-op but began having bleeding from L upper gums earlier today. She called the dentist who recommended she take out her dentures and try using gauze packing to stop the bleeding but that has not been effective. She reports continued bleeding, minimal pain.    Home Medications Prior to Admission medications   Medication Sig Start Date End Date Taking? Authorizing Provider  apixaban (ELIQUIS) 5 MG TABS tablet Take 1 tablet (5 mg total) by mouth 2 (two) times daily. 12/01/22   Leonie Man, MD  Ascorbic Acid (VITAMIN C WITH ROSE HIPS) 500 MG tablet Take 500 mg by mouth daily.    [provider]  Calcium-Magnesium-Zinc [phone] MG TABS Take by mouth.    [provider]  cholecalciferol (VITAMIN D) 1000 UNITS tablet Take 1,000 Units by mouth at bedtime.     [provider]  cyanocobalamin (VITAMIN B12) 1000 MCG/ML injection Inject 1,000 mcg into the muscle every 30 (thirty) days. 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    [provider]  levothyroxine (SYNTHROID) 88 MCG tablet Take 88 mcg by mouth. 03/22/22   [provider]  valsartan-hydrochlorothiazide (DIOVAN-HCT) 320-12.5 MG tablet Take 1 tablet by mouth daily. 02/11/22   [provider]     Allergies    Patient has no known allergies.   Review of Systems   Review of Systems Please see HPI for pertinent positives and negatives  Physical Exam BP (!) 187/81 (BP Location: Right Arm)   Pulse (!) 55   Temp 97.7 F (36.5 C) (Oral)   Resp 14   SpO2 100%    Physical Exam Vitals and nursing note reviewed.  HENT:     Head: Normocephalic.     Nose: Nose normal.     Mouth/Throat:     Comments: There is a focal area of brisk but not pulsatile bleeding from L upper gums.  Eyes:     Extraocular Movements: Extraocular movements intact.  Pulmonary:     Effort: Pulmonary effort is normal.  Musculoskeletal:        General: Normal range of motion.     Cervical back: Neck supple.  Skin:    Findings: No rash (on exposed skin).  Neurological:     Mental Status: She is alert and oriented to person, place, and time.  Psychiatric:        Mood and Affect: Mood normal.     ED Results / Procedures / Treatments   EKG None  Procedures Procedures  Medications Ordered in the ED Medications - No data to display  Initial Impression and Plan  Plain gauze has not been effective at stopping the bleeding. Will try combat gauze and reassess.   ED Course   Clinical Course as of 12/29/22 0251  Tue Dec 29, 2022  0222 Patient's bleeding has stopped for now. Will continue to hold combat gauze packing in place and reassess for continued hemostasis.  [CS]  [id number] Patient continued to do well. Plan discharge with instructions to follow up with her dentist in the AM to discuss holding her  Eliquis. Additional gauze given to use at home if needed. RTED for any other concerns.  [CS]    Clinical Course User Index [CS] Truddie Hidden, MD     MDM Rules/Calculators/A&P Medical Decision Making Problems Addressed: Bleeding gums: acute illness or injury  Risk Prescription drug management.     Final Clinical Impression(s) / ED Diagnoses Final diagnoses:  Bleeding gums    Rx / DC Orders ED Discharge Orders     None        Truddie Hidden, MD 12/29/22 [phone]

## 2022-12-29 NOTE — ED Notes (Signed)
Per MD verbal order, Quick Clot applied to gauze and placed between teeth on left side of mouth

## 2022-12-29 NOTE — ED Notes (Signed)
Dr. Karle Starch in to see pt. And reported more saliva than blood on gauze impregnated with combat clot.

## 2022-12-31 NOTE — Telephone Encounter (Signed)
Agree with seeing dentist.  But if you have bleeding like that just hold the blood thinner Eliquis for a day.  That will help allow it to clot.  Glenetta Hew, MD

## 2023-01-08 DIAGNOSIS — E538 Deficiency of other specified B group vitamins: Secondary | ICD-10-CM | POA: Diagnosis not present

## 2023-01-12 ENCOUNTER — Encounter: Payer: Self-pay | Admitting: Cardiovascular Disease

## 2023-01-12 ENCOUNTER — Ambulatory Visit: Payer: PPO | Attending: Cardiovascular Disease | Admitting: Cardiovascular Disease

## 2023-01-12 VITALS — BP 134/60 | HR 51 | Ht 60.0 in | Wt 128.2 lb

## 2023-01-12 DIAGNOSIS — D6869 Other thrombophilia: Secondary | ICD-10-CM

## 2023-01-12 DIAGNOSIS — R55 Syncope and collapse: Secondary | ICD-10-CM | POA: Diagnosis not present

## 2023-01-12 DIAGNOSIS — I495 Sick sinus syndrome: Secondary | ICD-10-CM | POA: Diagnosis not present

## 2023-01-12 DIAGNOSIS — I480 Paroxysmal atrial fibrillation: Secondary | ICD-10-CM

## 2023-01-12 NOTE — Patient Instructions (Addendum)
Medication Instructions:  Your physician recommends that you continue on your current medications as directed. Please refer to the Current Medication list given to you today.  *If you need a refill on your cardiac medications before your next appointment, please call your pharmacy*  Lab Work: BMET and CBC on July 3rd - walk-in anytime between 7:30am and 5:00pm  If you have labs (blood work) drawn today and your tests are completely normal, you will receive your results only by: [place] (if you have MyChart) OR A paper copy in the mail If you have any lab test that is abnormal or we need to change your treatment, we will call you to review the results.   Testing/Procedures: You will have a loop recorder placed at your next office visit. There are no restrictions or special instructions prior to this appointment.   Your physician has requested that you have cardiac CT. Cardiac computed tomography (CT) is a painless test that uses an x-ray machine to take clear, detailed pictures of your heart. For further information please visit [email or web address]. Please follow instruction sheet as given. We will call you to schedule your CT scan. This will be done about a week prior to your ablation.  Your physician has recommended that you have an ablation. Catheter ablation is a medical procedure used to treat some cardiac arrhythmias (irregular heartbeats). During catheter ablation, a long, thin, flexible tube is put into a blood vessel in your groin (upper thigh), or neck. This tube is called an ablation catheter. It is then guided to your heart through the blood vessel. Radio frequency waves destroy small areas of heart tissue where abnormal heartbeats may cause an arrhythmia to start. Please see the instruction sheet given to you today. You are scheduled for Atrial Fibrillation Ablation on Wednesday, July 17 with Dr. Kaleen Odea.Please arrive at the Main Entrance A at [facility]: [place], [place] [address] at 9:30 AM    Follow-Up: At [facility], you and your health needs are our priority.  As part of our continuing mission to provide you with exceptional heart care, we have created designated Provider Care Teams.  These Care Teams include your primary Cardiologist (physician) and Advanced Practice Providers (APPs -  Physician Assistants and Nurse Practitioners) who all work together to provide you with the care you need, when you need it.  Your next appointment:   April 12th at 12:15pm   Provider:   Doralee Albino, MD

## 2023-01-12 NOTE — Progress Notes (Signed)
Electrophysiology Office Note:    Date:  01/12/2023   ID:  Alexis Ochoa, DOB 02/01/1951, MRN [id number]  PCP:  Harlan Stains, MD   [place] Prov[facility] Cardiologist:  Glenetta Hew, MD Electrophysiologist:  Melida Quitter, MD     Referring MD: Leonie Man, MD   History of Present Illness:    Alexis Ochoa is a 72 y.o. female with a hx listed below, significant for HTN, atrial fibrillation, thyroid disease, syncope referred for arrhythmia management.  She had an episode of syncope in December.  She had just gotten up and was walking around when she felt tunnel vision and then woke up on the floor.  She notes that she had a GI illness a few days prior to syncope and she was likely dehydrated.  She was diagnosed with AF Recently. Her rates in AF are extremely fast, up to 220 bpm. In sinus rhythm, rates are quite slow, her tachy-brady limiting her options for rate control.   She was prescribed Eliquis.  She did not begin the Eliquis initially because she was having teeth extracted for denture fitting.  She did start the Eliquis about 5 days after the extraction but began to have bleeding resulting in an ER visit.  She has held Eliquis since and will resume it after she has her extraction sites evaluated.   Past Medical History:  Diagnosis Date   History of blood transfusion 1970; 04/28/2017   "related to childbirth; related to anemia" (04/28/2017 - admitted with Pancytopenia - Dx Pernicious Anemia - Vit B12 deficiency)   Hypertension    Hypothyroidism    On levothyroxine   Iron deficiency anemia 04/28/2017   PAF (paroxysmal atrial fibrillation) ([place]) [id number]   Complicated by sinus bradycardia   Pancytopenia ([place]) 04/28/2017   Pernicious anemia    Situational depression    "when husband was real sick; when adopting grandchildren"    Past Surgical History:  Procedure Laterality Date   48 HR HOLTER MONITOR  05/2018   Sinus Bradycardia (35 @ ~2 AM & 7 AM) /  Sinus Rhythm / Sinus Arrhythmia. Max HR 94 ( Avg 54)PVC x 1. PAC's noted. NO symptoms reported on diary. Short 2-4 beat runs of PACs   ABDOMINAL HYSTERECTOMY  ~ 1991-1992   "still have my ovaries"   [place]   "before hysterectomy"   GRADED EXERCISE TOLERANCE TEST  05/2018   HTN response to exercise. No EKG changes c/w ischemia. 9:00 min - 10.1 METS. Peak HR 136 bpm (8% MPHR of 154 bpm).  BP increased from 192/80 to 210/139 mHg.   LAPAROSCOPIC CHOLECYSTECTOMY  11/2013    Current Medications: No outpatient medications have been marked as taking for the 01/12/23 encounter (Appointment) with Kania Regnier, Yetta Barre, MD.     Allergies:   Patient has no known allergies.   Social and Family History: Reviewed in Epic  ROS:   Please see the history of present illness.    All other systems reviewed and are negative.  EKGs/Labs/Other Studies Reviewed Today:    Echocardiogram:  TTE 12/24/2021 EF 60-65%. Normal LA size. Normal structure   Monitors:  Zio patch 14d 10/24/2022 Sinus rhythm HR 37-132, average 55 bpm Multiple atrial high rate episodes appear to be AF AF burden 1%. No pauses reported Frequent Atrial ectopy, 7.4%  Stress testing:  2019 - 85% maximum heart rate. No ST changes    Advanced imaging:     EKG:  Last EKG results: today -  sinus rhythm with frequent PACs.  Right bundle branch block, left anterior fascicular block.   Recent Labs: No results found for requested labs within last 365 days.     Physical Exam:    VS:  There were no vitals taken for this visit.    Wt Readings from Last 3 Encounters:  12/01/22 131 lb 3.2 oz (59.5 kg)  09/28/22 133 lb 9.6 oz (60.6 kg)  01/02/20 140 lb (63.5 kg)     GEN: Well nourished, well developed in no acute distress CARDIAC: RRR, no murmurs, rubs, gallops RESPIRATORY:  Normal work of breathing MUSCULOSKELETAL: no edema    ASSESSMENT & PLAN:    Paroxysmal atrial fibrillation Very rapid rates in  AF Asymptomatic --no palpitations, overt fatigue.  However, episodes have been very brief.  I suspect she would be highly symptomatic if persistent and likely developed heart failure given her rates. Rate control is not an option due to baseline bradycardia. I recommended attempting rhythm control prior to consideration of pacemaker.  We discussed options including Tikosyn versus early invasive strategy.  She would prefer an early invasive strategy. Also, given that she is asymptomatic and has extremely high rates, I think we need to monitor her atrial fibrillation closely.  I recommended loop recorder placement. I explained the logistics and rationale of the procedure, the process for monitoring and monthly fee.  We discussed the indication, rationale, logistics, anticipated benefits, and potential risks of the ablation procedure including but not limited to -- bleed at the groin access site, chest pain, damage to nearby organs such as the diaphragm, lungs, or esophagus, need for a drainage tube, or prolonged hospitalization. I explained that the risk for stroke, heart attack, need for open chest surgery, or even death is very low but not zero. she  expressed understanding and wishes to proceed.   Tachy-brady syndrome Monitor with loop recorder  Hypercoagulable state due to atrial fibrillation Eliqus 5 mg She had bleeding when she started Eliquis a few days after dental extraction. She will be re-evaluated by her dentist soon and will start eliquis when safe to do so  Syncope and collapse Suspect dehydration. Cannot exclude exacerbation by extreme tachycardia and A-fib, or postconversion pause. Monitor with loop recorder         Medication Adjustments/Labs and Tests Ordered: Current medicines are reviewed at length with the patient today.  Concerns regarding medicines are outlined above.  No orders of the defined types were placed in this encounter.  No orders of the defined types were  placed in this encounter.    Signed, Melida Quitter, MD  01/12/2023 7:11 AM    [place]

## 2023-01-27 ENCOUNTER — Telehealth: Payer: Self-pay | Admitting: Cardiology

## 2023-01-27 NOTE — Telephone Encounter (Signed)
Pt c/o medication issue:  1. Name of Medication:   apixaban (ELIQUIS) 5 MG TABS tablet   2. How are you currently taking this medication (dosage and times per day)?   As prescribed  3. Are you having a reaction (difficulty breathing--STAT)?   4. What is your medication issue?   Patient wants to know when she will need to stop her Eliquis for procedure on 4/12.

## 2023-01-27 NOTE — Telephone Encounter (Signed)
Called pt and she would like to know if she needs to hold her Eliquis before loop? Will forward to [facility] office

## 2023-01-28 NOTE — Telephone Encounter (Signed)
Patient is aware she can hold eliquis the morning of loop procedure. She verbalized understading.

## 2023-01-29 ENCOUNTER — Ambulatory Visit: Payer: PPO | Attending: Cardiovascular Disease | Admitting: Cardiovascular Disease

## 2023-01-29 ENCOUNTER — Encounter: Payer: Self-pay | Admitting: Cardiovascular Disease

## 2023-01-29 VITALS — BP 120/70 | HR 55 | Ht 60.0 in | Wt 125.8 lb

## 2023-01-29 DIAGNOSIS — I480 Paroxysmal atrial fibrillation: Secondary | ICD-10-CM

## 2023-01-29 DIAGNOSIS — R55 Syncope and collapse: Secondary | ICD-10-CM | POA: Diagnosis not present

## 2023-01-29 NOTE — Patient Instructions (Signed)
Medication Instructions:  Your physician recommends that you continue on your current medications as directed. Please refer to the Current Medication list given to you today. *If you need a refill on your cardiac medications before your next appointment, please call your pharmacy*  Follow-Up: At [facility], you and your health needs are our priority.  As part of our continuing mission to provide you with exceptional heart care, we have created designated Provider Care Teams.  These Care Teams include your primary Cardiologist (physician) and Advanced Practice Providers (APPs -  Physician Assistants and Nurse Practitioners) who all work together to provide you with the care you need, when you need it.  We recommend signing up for the patient portal called "MyChart".  Sign up information is provided on this After Visit Summary.  MyChart is used to connect with patients for Virtual Visits (Telemedicine).  Patients are able to view lab/test results, encounter notes, upcoming appointments, etc.  Non-urgent messages can be sent to your provider as well.   To learn more about what you can do with MyChart, go to [email or web address].    Your next appointment:   We will schedule follow up after ablation   Provider:   York Pellant, MD    Other Instructions   Implantable Loop Recorder Placement, Care After This sheet gives you information about how to care for yourself after your procedure. Your health care provider may also give you more specific instructions. If you have problems or questions, contact your health care provider. What can I expect after the procedure? After the procedure, it is common to have: Soreness or discomfort near the incision. Some swelling or bruising near the incision.  Follow these instructions at home: Incision care  Monitor your cardiac device site for redness, swelling, and drainage. Call the device clinic at [phone] if you experience these  symptoms or fever/chills.  Keep the large square bandage on your site for 24 hours and then you may remove it yourself. Keep the steri-strips underneath in place.   You may shower after 72 hours / 3 days from your procedure with the steri-strips in place. They will usually fall off on their own, or may be removed after 10 days. Pat dry.   Avoid lotions, ointments, or perfumes over your incision until it is well-healed.  Please do not submerge in water until your site is completely healed.   Your device is MRI compatible.   Remote monitoring is used to monitor your cardiac device from home. This monitoring is scheduled every month by our office. It allows [place] to keep an eye on the function of your device to ensure it is working properly.  If your wound site starts to bleed apply pressure.    For help with the monitor please call [employer] Monitor Support Specialist directly at [phone].    If you have any questions/concerns please call the device clinic at [phone].  Activity  Return to your normal activities.  General instructions Follow instructions from your health care provider about how to manage your implantable loop recorder and transmit the information. Learn how to activate a recording if this is necessary for your type of device. You may go through a metal detection gate, and you may let someone hold a metal detector over your chest. Show your ID card if needed. Do not have an MRI unless you check with your health care provider first. Take over-the-counter and prescription medicines only as told by your health care provider. Keep all follow-up  visits as told by your health care provider. This is important. Contact a health care provider if: You have redness, swelling, or pain around your incision. You have a fever. You have pain that is not relieved by your pain medicine. You have triggered your device because of fainting (syncope) or because of a heartbeat that  feels like it is racing, slow, fluttering, or skipping (palpitations). Get help right away if you have: Chest pain. Difficulty breathing. Summary After the procedure, it is common to have soreness or discomfort near the incision. Change your dressing as told by your health care provider. Follow instructions from your health care provider about how to manage your implantable loop recorder and transmit the information. Keep all follow-up visits as told by your health care provider. This is important. This information is not intended to replace advice given to you by your health care provider. Make sure you discuss any questions you have with your health care provider. Document Released: 09/16/2015 Document Revised: 11/20/2017 Document Reviewed: 11/20/2017 Elsevier Patient Education  2020 [employer].

## 2023-01-29 NOTE — Progress Notes (Signed)
Electrophysiology Office Note:    Date:  01/29/2023   ID:  Alexis Ochoa, DOB 20-May-1951, MRN [id number]  PCP:  Laurann Montana, MD   [facility] Providers Cardiologist:  Bryan Lemma, MD Electrophysiologist:  Maurice Small, MD     Referring MD: Laurann Montana, MD   History of Present Illness:    Alexis Ochoa is a 72 y.o. female with a hx listed below, significant for HTN, atrial fibrillation, thyroid disease, syncope referred for arrhythmia management.  She had an episode of syncope in December.  She had just gotten up and was walking around when she felt tunnel vision and then woke up on the floor.  She notes that she had a GI illness a few days prior to syncope and she was likely dehydrated.  She was diagnosed with AF Recently. Her rates in AF are extremely fast, up to 220 bpm. In sinus rhythm, rates are quite slow, her tachy-brady limiting her options for rate control.   She was prescribed Eliquis.  She did not begin the Eliquis initially because she was having teeth extracted for denture fitting.  She did start the Eliquis about 5 days after the extraction but began to have bleeding resulting in an ER visit.  She has held Eliquis since and will resume it after she has her extraction sites evaluated.  She presents today for placement of her ILR. She has no new complaints.   Past Medical History:  Diagnosis Date   History of blood transfusion 1970; 04/28/2017   "related to childbirth; related to anemia" (04/28/2017 - admitted with Pancytopenia - Dx Pernicious Anemia - Vit B12 deficiency)   Hypertension    Hypothyroidism    On levothyroxine   Iron deficiency anemia 04/28/2017   PAF (paroxysmal atrial fibrillation) 09/2022   Complicated by sinus bradycardia   Pancytopenia 04/28/2017   Pernicious anemia    Situational depression    "when husband was real sick; when adopting grandchildren"    Past Surgical History:  Procedure Laterality Date   48 HR HOLTER  MONITOR  05/2018   Sinus Bradycardia (35 @ ~2 AM & 7 AM) / Sinus Rhythm / Sinus Arrhythmia. Max HR 94 ( Avg 54)PVC x 1. PAC's noted. NO symptoms reported on diary. Short 2-4 beat runs of PACs   ABDOMINAL HYSTERECTOMY  ~ 1991-1992   "still have my ovaries"   EXPLORATORY LAPAROTOMY  1980s   "before hysterectomy"   GRADED EXERCISE TOLERANCE TEST  05/2018   HTN response to exercise. No EKG changes c/w ischemia. 9:00 min - 10.1 METS. Peak HR 136 bpm (8% MPHR of 154 bpm).  BP increased from 192/80 to 210/139 mHg.   LAPAROSCOPIC CHOLECYSTECTOMY  11/2013    Current Medications: Current Meds  Medication Sig   apixaban (ELIQUIS) 5 MG TABS tablet Take 1 tablet (5 mg total) by mouth 2 (two) times daily.   Ascorbic Acid (VITAMIN C WITH ROSE HIPS) 500 MG tablet Take 500 mg by mouth daily.   Calcium-Magnesium-Zinc 334-134-5 MG TABS Take by mouth.   cholecalciferol (VITAMIN D) 1000 UNITS tablet Take 1,000 Units by mouth at bedtime.    cyanocobalamin (VITAMIN B12) 1000 MCG/ML injection Inject 1,000 mcg into the muscle every 30 (thirty) days.   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   levothyroxine (SYNTHROID) 88 MCG tablet Take 88 mcg by mouth.   valsartan-hydrochlorothiazide (DIOVAN-HCT) 320-12.5 MG tablet Take 1 tablet by mouth daily.  Allergies:   Patient has no known allergies.   Social and Family History: Reviewed in Epic  ROS:   Please see the history of present illness.    All other systems reviewed and are negative.  EKGs/Labs/Other Studies Reviewed Today:    Echocardiogram:  TTE 12/24/2021 EF 60-65%. Normal LA size. Normal structure   Monitors:  Zio patch 14d 10/24/2022 Sinus rhythm HR 37-132, average 55 bpm Multiple atrial high rate episodes appear to be AF AF burden 1%. No pauses reported Frequent Atrial ectopy, 7.4%  Stress testing:  2019 - 85% maximum heart rate. No ST changes    Advanced  imaging:     EKG:  Last EKG results: today - sinus rhythm with aberrantly conducted PACs occurring in bigeminy.  Right bundle branch block, left anterior fascicular block.   Recent Labs: No results found for requested labs within last 365 days.     Physical Exam:    VS:  BP 120/70   Pulse (!) 55   Ht 5' (1.524 m)   Wt 125 lb 12.8 oz (57.1 kg)   SpO2 99%   BMI 24.57 kg/m     Wt Readings from Last 3 Encounters:  01/29/23 125 lb 12.8 oz (57.1 kg)  01/12/23 128 lb 3.2 oz (58.2 kg)  12/01/22 131 lb 3.2 oz (59.5 kg)     GEN: Well nourished, well developed in no acute distress CARDIAC: RRR, no murmurs, rubs, gallops RESPIRATORY:  Normal work of breathing MUSCULOSKELETAL: no edema    ASSESSMENT & PLAN:    Paroxysmal atrial fibrillation Very rapid rates in AF Asymptomatic --no palpitations, overt fatigue.  However, episodes have been very brief.  I suspect she would be highly symptomatic if persistent and likely developed heart failure given her rates. Rate control is not an option due to baseline bradycardia. I recommended attempting rhythm control prior to consideration of pacemaker.  We discussed options including Tikosyn versus early invasive strategy.  She would prefer an early invasive strategy. Also, given that she is asymptomatic and has extremely high rates, I think we need to monitor her atrial fibrillation closely.  I recommended loop recorder placement. I explained the logistics and rationale of the procedure, the process for monitoring and monthly fee. She is here today for placement of the device   Tachy-brady syndrome Monitor with loop recorder  Hypercoagulable state due to atrial fibrillation Eliqus 5 mg She had bleeding when she started Eliquis a few days after dental extraction. She has an upcoming dental surgery. She may hold eliquis for 48 hours prior to the surgery  Syncope and collapse Suspect dehydration. Cannot exclude exacerbation by extreme  tachycardia and A-fib, or postconversion pause. Monitor with loop recorder         Medication Adjustments/Labs and Tests Ordered: Current medicines are reviewed at length with the patient today.  Concerns regarding medicines are outlined above.  No orders of the defined types were placed in this encounter.  No orders of the defined types were placed in this encounter.     PREPROCEDURE DIAGNOSIS:  Syncope, Atrial fibrillation management    POSTPROCEDURE DIAGNOSIS: Syncope, Atrial fibrillation management     PROCEDURES:   1. Implantable loop recorder implantation    INTRODUCTION:  Alexis Ochoa presents with a history of syncope anda trial fibrillation. The costs of loop recorder monitoring have been discussed with the patient.    DESCRIPTION OF PROCEDURE:  Informed written consent was obtained.  A timeout was performed. The patient required no  sedation for the procedure today. The patients left chest was prepped and draped in the usual sterile fashion. The skin overlying the left parasternal region was infiltrated with lidocaine for local analgesia.  A 0.5-cm incision was made over the left parasternal region over the 3rd intercostal space.  A subcutaneous ILR pocket was fashioned using a combination of sharp and blunt dissection.  A Medtronic Reveal LINQ 2 (SN: [id number]) implantable loop recorder was then placed into the pocket  R waves were very prominent and measured >0.93mV.  Steri- Strips and a sterile dressing were then applied.  There were no early apparent complications.     CONCLUSIONS:   1. Successful implantation of a implantable loop recorder for a history of syncope and atrial fibrillation.  2. No early apparent complications.   Maurice Small, MD  Cardiac Electrophysiology     Signed, Maurice Small, MD  01/29/2023 1:08 PM    [facility]

## 2023-02-10 ENCOUNTER — Other Ambulatory Visit: Payer: Self-pay

## 2023-02-10 ENCOUNTER — Emergency Department (HOSPITAL_BASED_OUTPATIENT_CLINIC_OR_DEPARTMENT_OTHER)
Admission: EM | Admit: 2023-02-10 | Discharge: 2023-02-10 | Disposition: A | Discharge status: Home / Self Care | Payer: PPO | Attending: Emergency Medicine | Admitting: Emergency Medicine

## 2023-02-10 ENCOUNTER — Encounter (HOSPITAL_BASED_OUTPATIENT_CLINIC_OR_DEPARTMENT_OTHER): Payer: Self-pay

## 2023-02-10 DIAGNOSIS — E538 Deficiency of other specified B group vitamins: Secondary | ICD-10-CM | POA: Diagnosis not present

## 2023-02-10 DIAGNOSIS — K068 Other specified disorders of gingiva and edentulous alveolar ridge: Secondary | ICD-10-CM | POA: Diagnosis not present

## 2023-02-10 NOTE — ED Triage Notes (Signed)
Patient arrives from home with bleeding lower jaw after having dental work on 02/02/2023. Patient was told by her pcp to restart eliquis on 02/02/2023. Patient arrives with gauze in mouth to help control bleeding. Patient has been bleeding since 3am

## 2023-02-10 NOTE — ED Provider Notes (Signed)
[name] [facility]ERGENCY DE[facility]MENT AT [facility] Provider Note   CSN: [id number] Arrival date & time: 02/10/23  0534     History  Chief Complaint  Patient presents with   Dental Problem    Alexis Ochoa is a 72 y.o. female.  72 yo F with a chief complaints of bleeding after having an alveoloplasty.  She just resumed her Eliquis about a week ago.  This morning about an hour ago realized that she was having some bleeding onto her pillow.  She had been given some combat gauze from her last visit to the ER after some bleeding after dental procedure and she attempted to place that but had ongoing bleeding and came here for evaluation.        Home Medications Prior to Admission medications   Medication Sig Start Date End Date Taking? Authorizing Provider  apixaban (ELIQUIS) 5 MG TABS tablet Take 1 tablet (5 mg total) by mouth 2 (two) times daily. 12/01/22   Marykay Lex, MD  Ascorbic Acid (VITAMIN C WITH ROSE HIPS) 500 MG tablet Take 500 mg by mouth daily.    [provider]  Calcium-Magnesium-Zinc [phone] MG TABS Take by mouth.    [provider]  cholecalciferol (VITAMIN D) 1000 UNITS tablet Take 1,000 Units by mouth at bedtime.     [provider]  cyanocobalamin (VITAMIN B12) 1000 MCG/ML injection Inject 1,000 mcg into the muscle every 30 (thirty) days. 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    [provider]  levothyroxine (SYNTHROID) 88 MCG tablet Take 88 mcg by mouth. 03/22/22   [provider]  valsartan-hydrochlorothiazide (DIOVAN-HCT) 320-12.5 MG tablet Take 1 tablet by mouth daily. 02/11/22   [provider]      Allergies    Patient has no known allergies.    Review of Systems   Review of Systems  Physical Exam Updated Vital Signs BP (!) 205/71 (BP Location: Right Arm)   Pulse (!) 58   Resp 20   Ht 5'  (1.524 m)   Wt 57.1 kg   SpO2 100%   BMI 24.57 kg/m  Physical Exam Vitals and nursing note reviewed.  Constitutional:      General: She is not in acute distress.    Appearance: She is well-developed. She is not diaphoretic.  HENT:     Head: Normocephalic and atraumatic.     Mouth/Throat:     Comments: edentulous she has looks like a hemangioma to the right lower front tooth.  There is some oozing of blood to the posterior aspect of it.  No other obvious noted areas of bleeding. Eyes:     Pupils: Pupils are equal, round, and reactive to light.  Cardiovascular:     Rate and Rhythm: Normal rate and regular rhythm.     Heart sounds: No murmur heard.    No friction rub. No gallop.  Pulmonary:     Effort: Pulmonary effort is normal.     Breath sounds: No wheezing or rales.  Abdominal:     General: There is no distension.     Palpations: Abdomen is soft.     Tenderness: There is no abdominal tenderness.  Musculoskeletal:        General: No tenderness.     Cervical back: Normal range of motion and neck supple.  Skin:    General: Skin is warm and dry.  Neurological:  Mental Status: She is alert and oriented to person, place, and time.  Psychiatric:        Behavior: Behavior normal.     ED Results / Procedures / Treatments   Labs (all labs ordered are listed, but only abnormal results are displayed) Labs Reviewed - No data to display  EKG None  Radiology No results found.  Procedures Procedures    Medications Ordered in ED Medications - No data to display  ED Course/ Medical Decision Making/ A&P                             Medical Decision Making  72 yo F with a chief complaints of bleeding.  Patient had a alveoplasty to prepare her for dentures.  This was about a week ago.  She awoke this morning with some bleeding.  She tried some combat gauze that she had been given at her last ED visit with dental bleeding.  She is on Eliquis which she recently restarted.   She is on that for A-fib.  Will attempt to control bleeding with direct pressure.  Reassess.  Bleeding controlled after direct pressure with combat gauze for about 25 minutes.  Will have her follow-up with her dentist.  6:52 AM:  I have discussed the diagnosis/risks/treatment options with the patient and family.  Evaluation and diagnostic testing in the emergency department does not suggest an emergent condition requiring admission or immediate intervention beyond what has been performed at this time.  They will follow up with Dentistry. We also discussed returning to the ED immediately if new or worsening sx occur. We discussed the sx which are most concerning (e.g., sudden worsening pain, fever, inability to tolerate by mouth) that necessitate immediate return. Medications administered to the patient during their visit and any new prescriptions provided to the patient are listed below.  Medications given during this visit Medications - No data to display   The patient appears reasonably screen and/or stabilized for discharge and I doubt any other medical condition or other [facility] requiring further screening, evaluation, or treatment in the ED at this time prior to discharge.          Final Clinical Impression(s) / ED Diagnoses Final diagnoses:  Gums, bleeding    Rx / DC Orders ED Discharge Orders     None         Melene Plan, DO 02/10/23 9528

## 2023-02-10 NOTE — Discharge Instructions (Addendum)
Please call your dentist first thing this morning and let them know about your issue.  To be an almost looks like you have developed collection of blood vessels that will easily bleed.  This is something that your dentist may need to take a look at it and see if there is something else they want to do with it.  If the bleeding reoccurs between now and then then please apply a bit of combat gauze there and hold pressure for at least 15 minutes without stopping.  If you try this a couple times and it does not work then you should return back to the emergency department for repeat evaluation.

## 2023-02-10 NOTE — ED Notes (Addendum)
Combat gauze applied to patient lower jaw with regular gauze per Dr. Adela Lank. Patient biting down on gauze in an attempt to control bleeding. Will recheck in 15 mins.

## 2023-02-10 NOTE — ED Notes (Signed)
This nurse checked bleeding of gum, small area still bleeding, combat gauze replaced, patient continuing to bite down on gauze in attempt to control bleeding.

## 2023-03-08 ENCOUNTER — Ambulatory Visit (INDEPENDENT_AMBULATORY_CARE_PROVIDER_SITE_OTHER): Payer: PPO

## 2023-03-08 DIAGNOSIS — I480 Paroxysmal atrial fibrillation: Secondary | ICD-10-CM

## 2023-03-08 LAB — CUP PACEART REMOTE DEVICE CHECK
Date Time Interrogation Session: 20240519231028
Implantable Pulse Generator Implant Date: 20240412

## 2023-03-12 DIAGNOSIS — E538 Deficiency of other specified B group vitamins: Secondary | ICD-10-CM | POA: Diagnosis not present

## 2023-03-30 ENCOUNTER — Encounter: Payer: Self-pay | Admitting: Cardiology

## 2023-03-30 ENCOUNTER — Ambulatory Visit: Payer: PPO | Attending: Cardiology | Admitting: Cardiology

## 2023-03-30 VITALS — BP 152/62 | HR 49 | Ht 60.0 in | Wt 129.6 lb

## 2023-03-30 DIAGNOSIS — R001 Bradycardia, unspecified: Secondary | ICD-10-CM

## 2023-03-30 DIAGNOSIS — I480 Paroxysmal atrial fibrillation: Secondary | ICD-10-CM | POA: Diagnosis not present

## 2023-03-30 DIAGNOSIS — I495 Sick sinus syndrome: Secondary | ICD-10-CM | POA: Diagnosis not present

## 2023-03-30 DIAGNOSIS — R55 Syncope and collapse: Secondary | ICD-10-CM | POA: Diagnosis not present

## 2023-03-30 DIAGNOSIS — I10 Essential (primary) hypertension: Secondary | ICD-10-CM | POA: Diagnosis not present

## 2023-03-30 DIAGNOSIS — D6869 Other thrombophilia: Secondary | ICD-10-CM

## 2023-03-30 NOTE — Progress Notes (Signed)
Primary Care Provider: Laurann Montana, MD [facility] Cardiologist: Bryan Lemma, MD Electrophysiologist: Maurice Small, MD  Clinic Note: Chief Complaint  Patient presents with   Follow-up    Stable.  Recent ILR insertion.  Normal bleeding on Eliquis.   Atrial Fibrillation    ILR placed, pending ablation   ===================================  ASSESSMENT/PLAN   Problem List Items Addressed This Visit       Cardiology Problems   Tachycardia-bradycardia syndrome (HCC) (Chronic)    Seen by Dr. Nelly Laurence, he agreed that treating her for rapid A-fib would be very difficult based on her bradycardia.  As such, he is placed loop recorder with plans for A-fib ablation.  Thankfully, no angina.  Or heart failure.  No prolonged episodes or breakthrough seen on the ILR      Relevant Orders   EKG 12-Lead (Completed)   Sinus bradycardia (Chronic)    Heart rate of 49 at baseline with PACs and bigeminy.  Thankfully she is not overly symptomatic.  Not currently on any type of rhythm control agent.  All the more reason for determining whether or not PPM would be required versus ablation.  Defer to Dr. Nelly Laurence.      Paroxysmal atrial fibrillation (HCC) (Chronic)    Relatively new diagnosis although probably has been present longer anything. Monitor showed 1% A-fib burden.  Currently has a loop recorder in place with no obvious breakthrough spells seen yet.  Not on rate control agent for reasons noted-significant bradycardia at baseline.  Finally on DOAC.      Relevant Orders   EKG 12-Lead (Completed)   Hypercoagulable state due to paroxysmal atrial fibrillation (HCC): CHA2DS2-VASc Score 4 (age-37, HTN, female, aortic calcification) (Chronic)    She did not start Eliquis until her teeth were pulled.  Despite that she has had these 2 episodes of gum bleeding.  Now seems to be stable.  No further bleeding.  Okay to hold Eliquis 2 days preop for surgeries or procedures.  3 days for  more high risk procedures.      Essential hypertension, benign - Primary (Chronic)    BP is again a little high today, but is usually better controlled at home on valsartan-HCTZ.  Clearly no AV nodal agent because of bradycardia.  With her having had syncope from likely vasovagal symptoms I would be reluctant to be overly aggressive.  Continue to monitor.        Other   Syncope and collapse (Chronic)    No breakthrough episodes.  ILR placed       ===================================  HPI:    Alexis Ochoa is a 72 y.o. female with a PMH notable for HTN, hypothyroidism (on Synthroid), PAF (low burden.-1%; on Eliquis, but no rate control because of bradycardia) With History of Syncope who presents today for 5-month follow-up at the request of Laurann Montana, MD.  I last saw Alexis Ochoa on February 13 for follow-up evaluation of BRADYCARDIA and SYNCOPE =>   She was initially seen back in October 2023 for syncope that was felt to be orthostatic/vasovagal.  We did check aZio patch monitor which showed 1% A-fib burden => New Dx A-fib with heart rates in the 220 bpm range.  This was the setting of baseline bradycardia. => Started on Eliquis 5 mg twice daily, but not started on rate control agent.  Echo ordered, referred to EP. => She noted feeling worn out and fatigued on the day that she was found to have A-fib.  She had a rough night the previous night having to "put her dog down" spending all night at the veterinarian's office.  She is very tired and actually been digging a hole to bury her dog.  She felt her heart rate going very fast and may be a little lightheaded but no chest pain or pressure.  No syncope or near syncope.  Otherwise very active.  Trying to do her best stay hydrated.  She did not start Eliquis until after her dental procedure with 11 teeth pulled.  Unfortunately, she had bleeding shortly after.  See below.  She was seen by Dr. Nelly Laurence on 01/12/2023 who was concerned about  the fact that her atrial fibrillation's were very rapid in conjunction with baseline sinus bradycardia.  Rhythm control will be very difficult without PPM placement.  Wanted to follow her very closely. => Planned ILR placement.  Recommended restarting Eliquis once safe from dental standpoint.  Continue to encourage hydration. => She was then seen back on April 12 for ILR insertion.  No new complaints. Tentatively scheduled for A-fib ablation on July 17 which would then allow for rhythm control based on ILR interrogation.  Recent Hospitalizations:  [facility] 12/29/2022 => status post 11 teeth extraction earlier that month.  Now bleeding in the left upper gum short after starting back on Eliquis. => Wound was packed with gauze  [facility] 02/10/2023-gum bleeding => this was following maxillary teeth extraction and alveoloplasty with continued gum bleeding.  She had recently resumed Eliquis. => Bleeding was controlled with combat gauze compression for 25 minutes.  Told to follow-up with dentist. Seen by [facility] later that day => noted large hematoma on mandibular ridge.  No pain but unable to get bleeding to stop. => Lesion was cauterized with Bovie-control the bleeding.  Reviewed  CV studies:    The following studies were reviewed today: (if available, images/films reviewed: From Epic Chart or Care Everywhere) ILR insertion 01/29/2023:  Interval History:   Alexis Ochoa presents here today for evaluation after being seen twice by Dr. Linna Darner for ILR insertion.  As far as she can tell there is been no recurrent episodes noted on the loop recorder.  No further syncopal episodes.  No dizziness or wooziness.  No chest pain or pressure with rest or exertion.  No PND, orthopnea with trivial edema.  She is may be more aware of her heart symptoms and therefore may feel some regular heartbeats every now and then, but has not noticed prolonged spells.  No sensation of  A-fib. BP on recheck was 134/65.  Previous evaluation to be 116/64.  CV Review of Symptoms (Summary): no chest pain or dyspnea on exertion positive for - irregular heartbeat and send of some short lived palpitations but nothing significant.  Otherwise BP has been up and down some but nothing dramatic. negative for - edema, orthopnea, paroxysmal nocturnal dyspnea, rapid heart rate, shortness of breath, or syncope or near syncope, CVA/TIA/amaurosis fugax or claudication  REVIEWED OF SYSTEMS   Review of Systems  Constitutional:  Negative for malaise/fatigue and weight loss.  HENT:  Negative for congestion.        No further dental bleeding.  Respiratory:  Negative for cough.   Gastrointestinal:  Negative for blood in stool and melena.  Genitourinary:  Negative for hematuria.  Musculoskeletal:  Negative for back pain and falls.  Neurological:  Negative for dizziness and weakness.  Psychiatric/Behavioral: Negative.      I have reviewed  and (if needed) personally updated the patient's problem list, medications, allergies, past medical and surgical history, social and family history.   PAST MEDICAL HISTORY   Past Medical History:  Diagnosis Date   History of blood transfusion 1970; 04/28/2017   "related to childbirth; related to anemia" (04/28/2017 - admitted with Pancytopenia - Dx Pernicious Anemia - Vit B12 deficiency)   Hypertension    Hypothyroidism    On levothyroxine   Iron deficiency anemia 04/28/2017   PAF (paroxysmal atrial fibrillation) (HCC) 09/2022   Complicated by sinus bradycardia   Pancytopenia (HCC) 04/28/2017   Pernicious anemia    Situational depression    "when husband was real sick; when adopting grandchildren"    PAST SURGICAL HISTORY   Past Surgical History:  Procedure Laterality Date   48 HR HOLTER MONITOR  05/2018   Sinus Bradycardia (35 @ ~2 AM & 7 AM) / Sinus Rhythm / Sinus Arrhythmia. Max HR 94 ( Avg 54)PVC x 1. PAC's noted. NO symptoms reported on  diary. Short 2-4 beat runs of PACs   ABDOMINAL HYSTERECTOMY  ~ 1991-1992   "still have my ovaries"   EXPLORATORY LAPAROTOMY  1980s   "before hysterectomy"   GRADED EXERCISE TOLERANCE TEST  05/2018   HTN response to exercise. No EKG changes c/w ischemia. 9:00 min - 10.1 METS. Peak HR 136 bpm (8% MPHR of 154 bpm).  BP increased from 192/80 to 210/139 mHg.   LAPAROSCOPIC CHOLECYSTECTOMY  11/2013   14-day Zio patch monitor: Predominantly Sinus Rhythm (HR range 37-132 bpm, avg 55 bpm).  1% Afib - HR range 87-220 bpm (avg 130 bpm) -longest episode 3hr 13 min - avg hr 130 bpm; longest episode was followed by secondary 9:33 min run.  Frequent PACs (7.4%), with rare PACs.  No prolonged bradycardia or pauses.  A-fib not noticed.   There is no immunization history on file for this patient.  MEDICATIONS/ALLERGIES   Current Meds  Medication Sig   apixaban (ELIQUIS) 5 MG TABS tablet Take 1 tablet (5 mg total) by mouth 2 (two) times daily.   Ascorbic Acid (VITAMIN C WITH ROSE HIPS) 500 MG tablet Take 500 mg by mouth daily.   Calcium-Magnesium-Zinc 334-134-5 MG TABS Take by mouth.   cholecalciferol (VITAMIN D) 1000 UNITS tablet Take 1,000 Units by mouth at bedtime.    cyanocobalamin (VITAMIN B12) 1000 MCG/ML injection Inject 1,000 mcg into the muscle every 30 (thirty) days.   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   levothyroxine (SYNTHROID) 88 MCG tablet Take 88 mcg by mouth.   valsartan-hydrochlorothiazide (DIOVAN-HCT) 320-12.5 MG tablet Take 1 tablet by mouth daily.    No Known Allergies  SOCIAL HISTORY/FAMILY HISTORY   Reviewed in Epic:  Pertinent findings:  Social History   Tobacco Use   Smoking status: Never   Smokeless tobacco: Never  Vaping Use   Vaping Use: Never used  Substance Use Topics   Alcohol use: No   Drug use: No   Social History   Social History Narrative   She is widowed x1" 2011) remarried 2012 to her  second husband Gaynell Face).   She has 2 daughters Clydie Braun, Angelyn (negative [place]).   She adopted and raised 3 granddaughters [place], Amil Amen and [place]).   She never smoked, but did have excessive secondhand smoke.  Does not drink alcohol or caffeine.   Does to [facility] classes a [facility] at the senior center (aerobics and yoga).   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    OBJCTIVE -PE, EKG, labs   Wt Readings from Last 3 Encounters:  03/30/23 129 lb 9.6 oz (58.8 kg)  02/10/23 125 lb 12.7 oz (57.1 kg)  01/29/23 125 lb 12.8 oz (57.1 kg)    Physical Exam: BP (!) 152/62   Pulse (!) 49   Ht 5' (1.524 m)   Wt 129 lb 9.6 oz (58.8 kg)   BMI 25.31 kg/m  => This morning her BP was 134/65 and yesterday was 116/64 at home. Physical Exam Vitals reviewed.  Constitutional:      General: She is not in acute distress.    Appearance: Normal appearance. She is normal weight. She is not ill-appearing or toxic-appearing.  HENT:     Head: Normocephalic and atraumatic.  Neck:     Vascular: No carotid bruit.  Cardiovascular:     Rate and Rhythm: Regular rhythm. Bradycardia present.     Pulses: Normal pulses.     Heart sounds: No murmur heard.    No friction rub. No gallop.     Comments: Normal S1, split S2 Pulmonary:     Effort: Pulmonary effort is normal. No respiratory distress.     Breath sounds: Normal breath sounds. No wheezing, rhonchi or rales.  Musculoskeletal:        General: No swelling. Normal range of motion.     Cervical back: Normal range of motion and neck supple.  Skin:    General: Skin is warm and dry.  Neurological:     General: No focal deficit present.     Mental Status: She is alert and oriented to person, place, and time.  Psychiatric:        Mood and Affect: Mood normal.        Behavior: Behavior normal.        Thought Content: Thought content normal.        Judgment: Judgment normal.     Adult ECG Report  Rate: 49 ;  Rhythm: sinus bradycardia, premature  atrial contractions (PAC), and bigeminy pattern.  Left axis deviation, RBBB with LVH.  Cannot rule out septal infarct, age-indeterminate. ;   Narrative Interpretation: PACs and bigeminy new.  Recent Labs: Reviewed 09/14/2022: TC 168, TG 73, HDL 58, LDL 93.  Hgb 13.1. No results found for: "CHOL", "HDL", "LDLCALC", "LDLDIRECT", "TRIG", "CHOLHDL"  ================================================== I spent a total of 18 minutes with the patient spent in direct patient consultation.  Additional time spent with chart review  / charting (studies, outside notes, etc): 14 min Total Time: 32 min  Current medicines are reviewed at length with the patient today.  (+/- concerns) N/A  Notice: This dictation was prepared with Dragon dictation along with smart phrase technology. Any transcriptional errors that result from this process are unintentional and may not be corrected upon review.  Studies Ordered:   Orders Placed This Encounter  Procedures   EKG 12-Lead   No orders of the defined types were placed in this encounter.   Patient Instructions / Medication Changes & Studies & Tests Ordered   Patient Instructions  Medication Instructions:  NONE *If you need a refill on your cardiac medications before your next appointment, please call your pharmacy*   Lab Work: NONE If you have labs (blood work) drawn today and your tests are completely normal, you will receive your results only by: MyChart Message (if you have MyChart) OR A paper copy in the mail If you have any lab test that is abnormal or we need  to change your treatment, we will call you to review the results.   Testing/Procedures: NONE   Follow-Up: At [facility], you and your health needs are our priority.  As part of our continuing mission to provide you with exceptional heart care, we have created designated Provider Care Teams.  These Care Teams include your primary Cardiologist (physician) and Advanced Practice  Providers (APPs -  Physician Assistants and Nurse Practitioners) who all work together to provide you with the care you need, when you need it.   Your next appointment:   7 month(s)  Provider:   Bryan Lemma, MD       Marykay Lex, MD, MS Bryan Lemma, M.D., M.S. Interventional Cardiologist  [facility]  Pager # [phone] Phone # [phone] [address] [place], [place] [address]   Thank you for choosing [facility] [place]!!

## 2023-03-30 NOTE — Patient Instructions (Addendum)
Medication Instructions:  NONE *If you need a refill on your cardiac medications before your next appointment, please call your pharmacy*   Lab Work: NONE If you have labs (blood work) drawn today and your tests are completely normal, you will receive your results only by: MyChart Message (if you have MyChart) OR A paper copy in the mail If you have any lab test that is abnormal or we need to change your treatment, we will call you to review the results.   Testing/Procedures: NONE   Follow-Up: At [facility], you and your health needs are our priority.  As part of our continuing mission to provide you with exceptional heart care, we have created designated Provider Care Teams.  These Care Teams include your primary Cardiologist (physician) and Advanced Practice Providers (APPs -  Physician Assistants and Nurse Practitioners) who all work together to provide you with the care you need, when you need it.   Your next appointment:   7 month(s)  Provider:   Bryan Lemma, MD

## 2023-04-04 ENCOUNTER — Encounter: Payer: Self-pay | Admitting: Cardiology

## 2023-04-04 NOTE — Assessment & Plan Note (Signed)
BP is again a little high today, but is usually better controlled at home on valsartan-HCTZ.  Clearly no AV nodal agent because of bradycardia.  With her having had syncope from likely vasovagal symptoms I would be reluctant to be overly aggressive.  Continue to monitor.

## 2023-04-04 NOTE — Assessment & Plan Note (Signed)
Relatively new diagnosis although probably has been present longer anything. Monitor showed 1% A-fib burden.  Currently has a loop recorder in place with no obvious breakthrough spells seen yet.  Not on rate control agent for reasons noted-significant bradycardia at baseline.  Finally on DOAC.

## 2023-04-04 NOTE — Assessment & Plan Note (Signed)
No breakthrough episodes.  ILR placed

## 2023-04-04 NOTE — Assessment & Plan Note (Addendum)
She did not start Eliquis until her teeth were pulled.  Despite that she has had these 2 episodes of gum bleeding.  Now seems to be stable.  No further bleeding.  Okay to hold Eliquis 2 days preop for surgeries or procedures.  3 days for more high risk procedures.

## 2023-04-04 NOTE — Assessment & Plan Note (Signed)
Heart rate of 49 at baseline with PACs and bigeminy.  Thankfully she is not overly symptomatic.  Not currently on any type of rhythm control agent.  All the more reason for determining whether or not PPM would be required versus ablation.  Defer to Dr. Nelly Laurence.

## 2023-04-04 NOTE — Assessment & Plan Note (Addendum)
Seen by Dr. Nelly Laurence, he agreed that treating her for rapid A-fib would be very difficult based on her bradycardia.  As such, he is placed loop recorder with plans for A-fib ablation.  Thankfully, no angina.  Or heart failure.  No prolonged episodes or breakthrough seen on the ILR

## 2023-04-06 NOTE — Progress Notes (Signed)
Carelink Summary Report / Loop Recorder 

## 2023-04-12 ENCOUNTER — Ambulatory Visit: Payer: PPO

## 2023-04-12 DIAGNOSIS — I495 Sick sinus syndrome: Secondary | ICD-10-CM | POA: Diagnosis not present

## 2023-04-12 LAB — CUP PACEART REMOTE DEVICE CHECK
Date Time Interrogation Session: 20240623231338
Implantable Pulse Generator Implant Date: 20240412

## 2023-04-14 DIAGNOSIS — E559 Vitamin D deficiency, unspecified: Secondary | ICD-10-CM | POA: Diagnosis not present

## 2023-04-14 DIAGNOSIS — Z0000 Encounter for general adult medical examination without abnormal findings: Secondary | ICD-10-CM | POA: Diagnosis not present

## 2023-04-14 DIAGNOSIS — F3340 Major depressive disorder, recurrent, in remission, unspecified: Secondary | ICD-10-CM | POA: Diagnosis not present

## 2023-04-14 DIAGNOSIS — I495 Sick sinus syndrome: Secondary | ICD-10-CM | POA: Diagnosis not present

## 2023-04-14 DIAGNOSIS — M8588 Other specified disorders of bone density and structure, other site: Secondary | ICD-10-CM | POA: Diagnosis not present

## 2023-04-14 DIAGNOSIS — D6869 Other thrombophilia: Secondary | ICD-10-CM | POA: Diagnosis not present

## 2023-04-14 DIAGNOSIS — I10 Essential (primary) hypertension: Secondary | ICD-10-CM | POA: Diagnosis not present

## 2023-04-14 DIAGNOSIS — L900 Lichen sclerosus et atrophicus: Secondary | ICD-10-CM | POA: Diagnosis not present

## 2023-04-14 DIAGNOSIS — H6993 Unspecified Eustachian tube disorder, bilateral: Secondary | ICD-10-CM | POA: Diagnosis not present

## 2023-04-14 DIAGNOSIS — E039 Hypothyroidism, unspecified: Secondary | ICD-10-CM | POA: Diagnosis not present

## 2023-04-14 DIAGNOSIS — I480 Paroxysmal atrial fibrillation: Secondary | ICD-10-CM | POA: Diagnosis not present

## 2023-04-14 DIAGNOSIS — E538 Deficiency of other specified B group vitamins: Secondary | ICD-10-CM | POA: Diagnosis not present

## 2023-04-19 ENCOUNTER — Encounter: Payer: Self-pay | Admitting: Family Medicine

## 2023-04-19 ENCOUNTER — Other Ambulatory Visit: Payer: Self-pay | Admitting: Family Medicine

## 2023-04-19 DIAGNOSIS — M8580 Other specified disorders of bone density and structure, unspecified site: Secondary | ICD-10-CM

## 2023-04-21 ENCOUNTER — Ambulatory Visit: Payer: PPO | Attending: Internal Medicine

## 2023-04-21 DIAGNOSIS — I480 Paroxysmal atrial fibrillation: Secondary | ICD-10-CM

## 2023-04-21 DIAGNOSIS — D6869 Other thrombophilia: Secondary | ICD-10-CM | POA: Diagnosis not present

## 2023-04-21 DIAGNOSIS — R55 Syncope and collapse: Secondary | ICD-10-CM | POA: Diagnosis not present

## 2023-04-21 DIAGNOSIS — I495 Sick sinus syndrome: Secondary | ICD-10-CM | POA: Diagnosis not present

## 2023-04-21 DIAGNOSIS — E538 Deficiency of other specified B group vitamins: Secondary | ICD-10-CM | POA: Diagnosis not present

## 2023-04-22 LAB — BASIC METABOLIC PANEL
BUN/Creatinine Ratio: 13 (ref 12–28)
BUN: 11 mg/dL (ref 8–27)
CO2: 26 mmol/L (ref 20–29)
Calcium: 9.1 mg/dL (ref 8.7–10.3)
Chloride: 95 mmol/L — ABNORMAL LOW (ref 96–106)
Creatinine, Ser: 0.84 mg/dL (ref 0.57–1.00)
Glucose: 81 mg/dL (ref 70–99)
Potassium: 3.6 mmol/L (ref 3.5–5.2)
Sodium: 132 mmol/L — ABNORMAL LOW (ref 134–144)
eGFR: 74 mL/min/{1.73_m2} (ref >59)

## 2023-04-22 LAB — CBC WITH DIFFERENTIAL/PLATELET
Basophils Absolute: 0 10*3/uL (ref 0.0–0.2)
Basos: 1 %
EOS (ABSOLUTE): 0.1 10*3/uL (ref 0.0–0.4)
Eos: 1 %
Hematocrit: 35.9 % (ref 34.0–46.6)
Hemoglobin: 12.1 g/dL (ref 11.1–15.9)
Immature Grans (Abs): 0 10*3/uL (ref 0.0–0.1)
Immature Granulocytes: 0 %
Lymphocytes Absolute: 1.3 10*3/uL (ref 0.7–3.1)
Lymphs: 34 %
MCH: 28.9 pg (ref 26.6–33.0)
MCHC: 33.7 g/dL (ref 31.5–35.7)
MCV: 86 fL (ref 79–97)
Monocytes Absolute: 0.5 10*3/uL (ref 0.1–0.9)
Monocytes: 14 %
Neutrophils Absolute: 1.8 10*3/uL (ref 1.4–7.0)
Neutrophils: 50 %
Platelets: 179 10*3/uL (ref 150–450)
RBC: 4.19 x10E6/uL (ref 3.77–5.28)
RDW: 13.1 % (ref 11.7–15.4)
WBC: 3.6 10*3/uL (ref 3.4–10.8)

## 2023-04-27 ENCOUNTER — Telehealth (HOSPITAL_COMMUNITY): Payer: Self-pay | Admitting: *Deleted

## 2023-04-27 NOTE — Telephone Encounter (Signed)
Attempted to call patient regarding upcoming cardiac CT appointment. °Left message on voicemail with name and callback number ° °Rayshell Goecke RN Navigator Cardiac Imaging °[facility] °[phone] Office °[phone] Cell ° °

## 2023-04-28 ENCOUNTER — Ambulatory Visit (HOSPITAL_COMMUNITY)
Admission: RE | Admit: 2023-04-28 | Discharge: 2023-04-28 | Disposition: A | Discharge status: Home / Self Care | Payer: PPO | Source: Ambulatory Visit | Attending: Cardiovascular Disease | Admitting: Cardiovascular Disease

## 2023-04-28 DIAGNOSIS — I495 Sick sinus syndrome: Secondary | ICD-10-CM | POA: Diagnosis not present

## 2023-04-28 DIAGNOSIS — D6869 Other thrombophilia: Secondary | ICD-10-CM | POA: Insufficient documentation

## 2023-04-28 DIAGNOSIS — I480 Paroxysmal atrial fibrillation: Secondary | ICD-10-CM | POA: Insufficient documentation

## 2023-04-28 DIAGNOSIS — R55 Syncope and collapse: Secondary | ICD-10-CM | POA: Diagnosis not present

## 2023-04-28 MED ORDER — IOHEXOL 350 MG/ML SOLN
100.0000 mL | Freq: Once | INTRAVENOUS | Status: AC | PRN
  Administered 2023-04-28: 100 mL via INTRAVENOUS

## 2023-04-30 ENCOUNTER — Other Ambulatory Visit: Payer: Self-pay | Admitting: Family Medicine

## 2023-04-30 DIAGNOSIS — M8580 Other specified disorders of bone density and structure, unspecified site: Secondary | ICD-10-CM

## 2023-04-30 DIAGNOSIS — Z139 Encounter for screening, unspecified: Secondary | ICD-10-CM

## 2023-05-03 NOTE — Progress Notes (Signed)
 Carelink Summary Report / Loop Recorder 

## 2023-05-04 NOTE — Pre-Procedure Instructions (Signed)
Instructed patient on the following items: Arrival time 0830 Nothing to eat or drink after midnight No meds AM of procedure Responsible person to drive you home and stay with you for 24 hrs  Have you missed any doses of anti-coagulant Eliquis- takes twice a day, hasn't missed any doses,  Don't take dose in the morning.

## 2023-05-05 ENCOUNTER — Encounter (HOSPITAL_COMMUNITY)
Admission: RE | Disposition: A | Discharge status: Home / Self Care | Payer: Self-pay | Source: Home / Self Care | Attending: Cardiovascular Disease

## 2023-05-05 ENCOUNTER — Other Ambulatory Visit (HOSPITAL_COMMUNITY): Payer: Self-pay

## 2023-05-05 ENCOUNTER — Ambulatory Visit (HOSPITAL_COMMUNITY): Payer: PPO | Admitting: Certified Registered Nurse Anesthetist

## 2023-05-05 ENCOUNTER — Encounter (HOSPITAL_COMMUNITY): Payer: Self-pay | Admitting: Cardiovascular Disease

## 2023-05-05 ENCOUNTER — Other Ambulatory Visit: Payer: Self-pay

## 2023-05-05 ENCOUNTER — Ambulatory Visit (HOSPITAL_COMMUNITY)
Admission: RE | Admit: 2023-05-05 | Discharge: 2023-05-05 | Disposition: A | Discharge status: Home / Self Care | Payer: PPO | Attending: Cardiovascular Disease | Admitting: Cardiovascular Disease

## 2023-05-05 ENCOUNTER — Ambulatory Visit (HOSPITAL_BASED_OUTPATIENT_CLINIC_OR_DEPARTMENT_OTHER): Payer: PPO | Admitting: Certified Registered Nurse Anesthetist

## 2023-05-05 DIAGNOSIS — D6869 Other thrombophilia: Secondary | ICD-10-CM | POA: Diagnosis not present

## 2023-05-05 DIAGNOSIS — I10 Essential (primary) hypertension: Secondary | ICD-10-CM

## 2023-05-05 DIAGNOSIS — I480 Paroxysmal atrial fibrillation: Secondary | ICD-10-CM | POA: Diagnosis not present

## 2023-05-05 DIAGNOSIS — R55 Syncope and collapse: Secondary | ICD-10-CM | POA: Diagnosis not present

## 2023-05-05 DIAGNOSIS — Z7901 Long term (current) use of anticoagulants: Secondary | ICD-10-CM | POA: Diagnosis not present

## 2023-05-05 DIAGNOSIS — E039 Hypothyroidism, unspecified: Secondary | ICD-10-CM | POA: Diagnosis not present

## 2023-05-05 DIAGNOSIS — E079 Disorder of thyroid, unspecified: Secondary | ICD-10-CM | POA: Diagnosis not present

## 2023-05-05 DIAGNOSIS — I495 Sick sinus syndrome: Secondary | ICD-10-CM | POA: Diagnosis not present

## 2023-05-05 DIAGNOSIS — I351 Nonrheumatic aortic (valve) insufficiency: Secondary | ICD-10-CM | POA: Diagnosis not present

## 2023-05-05 HISTORY — PX: ATRIAL FIBRILLATION ABLATION: EP1191

## 2023-05-05 SURGERY — ATRIAL FIBRILLATION ABLATION
Anesthesia: General

## 2023-05-05 MED ORDER — SODIUM CHLORIDE 0.9 % IV SOLN: 250.0000 mL | INTRAVENOUS | Status: DC | PRN

## 2023-05-05 MED ORDER — PHENYLEPHRINE 80 MCG/ML (10ML) SYRINGE FOR IV PUSH (FOR BLOOD PRESSURE SUPPORT)
PREFILLED_SYRINGE | INTRAVENOUS | Status: DC | PRN
  Administered 2023-05-05: 80 ug via INTRAVENOUS

## 2023-05-05 MED ORDER — HEPARIN (PORCINE) IN NACL 1000-0.9 UT/500ML-% IV SOLN
INTRAVENOUS | Status: DC | PRN
  Administered 2023-05-05 (×4): 500 mL

## 2023-05-05 MED ORDER — DEXAMETHASONE SODIUM PHOSPHATE 10 MG/ML IJ SOLN
INTRAMUSCULAR | Status: DC | PRN
  Administered 2023-05-05: 5 mg via INTRAVENOUS

## 2023-05-05 MED ORDER — HEPARIN SODIUM (PORCINE) 1000 UNIT/ML IJ SOLN
INTRAMUSCULAR | Status: DC | PRN
  Administered 2023-05-05: 1000 [IU] via INTRAVENOUS

## 2023-05-05 MED ORDER — SODIUM CHLORIDE 0.9 % IV SOLN: INTRAVENOUS | Status: DC

## 2023-05-05 MED ORDER — HEPARIN SODIUM (PORCINE) 1000 UNIT/ML IJ SOLN
INTRAMUSCULAR | Status: DC | PRN
  Administered 2023-05-05: 14000 [IU] via INTRAVENOUS

## 2023-05-05 MED ORDER — ROCURONIUM BROMIDE 10 MG/ML (PF) SYRINGE
PREFILLED_SYRINGE | INTRAVENOUS | Status: DC | PRN
  Administered 2023-05-05: 50 mg via INTRAVENOUS
  Administered 2023-05-05: 20 mg via INTRAVENOUS

## 2023-05-05 MED ORDER — DOBUTAMINE INFUSION FOR EP/ECHO/NUC (1000 MCG/ML)
INTRAVENOUS | Status: DC | PRN
  Administered 2023-05-05: 20 ug/kg/min via INTRAVENOUS

## 2023-05-05 MED ORDER — SODIUM CHLORIDE 0.9% FLUSH: 3.0000 mL | Freq: Two times a day (BID) | INTRAVENOUS | Status: DC

## 2023-05-05 MED ORDER — PHENYLEPHRINE HCL-NACL 20-0.9 MG/250ML-% IV SOLN
INTRAVENOUS | Status: DC | PRN
  Administered 2023-05-05: 20 ug/min via INTRAVENOUS

## 2023-05-05 MED ORDER — ONDANSETRON HCL 4 MG/2ML IJ SOLN
INTRAMUSCULAR | Status: DC | PRN
  Administered 2023-05-05: 4 mg via INTRAVENOUS

## 2023-05-05 MED ORDER — SODIUM CHLORIDE 0.9% FLUSH: 3.0000 mL | INTRAVENOUS | Status: DC | PRN

## 2023-05-05 MED ORDER — SUGAMMADEX SODIUM 200 MG/2ML IV SOLN
INTRAVENOUS | Status: DC | PRN
  Administered 2023-05-05: 200 mg via INTRAVENOUS

## 2023-05-05 MED ORDER — PANTOPRAZOLE SODIUM 40 MG PO TBEC
40.0000 mg | DELAYED_RELEASE_TABLET | Freq: Every day | ORAL | 0 refills | Status: DC
  Filled 2023-05-05: qty 30, 30d supply, fill #0

## 2023-05-05 MED ORDER — HEPARIN SODIUM (PORCINE) 1000 UNIT/ML IJ SOLN
INTRAMUSCULAR | Status: AC
  Filled 2023-05-05: qty 10

## 2023-05-05 MED ORDER — FENTANYL CITRATE (PF) 100 MCG/2ML IJ SOLN
INTRAMUSCULAR | Status: DC | PRN
  Administered 2023-05-05 (×2): 50 ug via INTRAVENOUS

## 2023-05-05 MED ORDER — PROTAMINE SULFATE 10 MG/ML IV SOLN
INTRAVENOUS | Status: DC | PRN
  Administered 2023-05-05 (×2): 20 mg via INTRAVENOUS
  Administered 2023-05-05: 10 mg via INTRAVENOUS

## 2023-05-05 MED ORDER — ACETAMINOPHEN 325 MG PO TABS
650.0000 mg | ORAL_TABLET | ORAL | Status: DC | PRN
  Administered 2023-05-05: 650 mg via ORAL
  Filled 2023-05-05: qty 2

## 2023-05-05 MED ORDER — LIDOCAINE 2% (20 MG/ML) 5 ML SYRINGE
INTRAMUSCULAR | Status: DC | PRN
  Administered 2023-05-05: 50 mg via INTRAVENOUS

## 2023-05-05 MED ORDER — DOBUTAMINE INFUSION FOR EP/ECHO/NUC (1000 MCG/ML)
INTRAVENOUS | Status: AC
  Filled 2023-05-05: qty 250

## 2023-05-05 MED ORDER — ONDANSETRON HCL 4 MG/2ML IJ SOLN: 4.0000 mg | Freq: Four times a day (QID) | INTRAMUSCULAR | Status: DC | PRN

## 2023-05-05 MED ORDER — COLCHICINE 0.6 MG PO TABS
0.6000 mg | ORAL_TABLET | Freq: Two times a day (BID) | ORAL | 0 refills | Status: DC
  Filled 2023-05-05: qty 10, 5d supply, fill #0

## 2023-05-05 MED ORDER — PROPOFOL 10 MG/ML IV BOLUS
INTRAVENOUS | Status: DC | PRN
  Administered 2023-05-05: 100 mg via INTRAVENOUS

## 2023-05-05 SURGICAL SUPPLY — 18 items
CATH 8FR REPROCESSED SOUNDSTAR (CATHETERS) ×1 IMPLANT
CATH 8FR SOUNDSTAR REPROCESSED (CATHETERS) IMPLANT
CATH ABLAT QDOT MICRO BI TC FJ (CATHETERS) IMPLANT
CATH OCTARAY 2.0 F 3-3-3-3-3 (CATHETERS) IMPLANT
CATH PIGTAIL STEERABLE D1 8.7 (WIRE) IMPLANT
CATH S-M CIRCA TEMP PROBE (CATHETERS) IMPLANT
CATH WEB BI DIR CSDF CRV REPRO (CATHETERS) IMPLANT
CLOSURE PERCLOSE PROSTYLE (VASCULAR PRODUCTS) IMPLANT
COVER SWIFTLINK CONNECTOR (BAG) ×2 IMPLANT
DEVICE CLOSURE MYNXGRIP 6/7F (Vascular Products) IMPLANT
PACK EP LATEX FREE (CUSTOM PROCEDURE TRAY) ×1
PACK EP LF (CUSTOM PROCEDURE TRAY) ×2 IMPLANT
PAD DEFIB RADIO PHYSIO CONN (PAD) ×2 IMPLANT
PATCH CARTO3 (PAD) IMPLANT
SHEATH CARTO VIZIGO MED CURVE (SHEATH) IMPLANT
SHEATH PINNACLE 8F 10CM (SHEATH) IMPLANT
SHEATH PINNACLE 9F 10CM (SHEATH) IMPLANT
TUBING SMART ABLATE COOLFLOW (TUBING) IMPLANT

## 2023-05-05 NOTE — Discharge Instructions (Signed)

## 2023-05-05 NOTE — Progress Notes (Signed)
 Up and walked and tolerated well; bilat groins stable, no bleeding or hematoma 

## 2023-05-05 NOTE — Progress Notes (Signed)
Dr Mealor in and ok to d/c home 

## 2023-05-05 NOTE — Anesthesia Preprocedure Evaluation (Addendum)
Anesthesia Evaluation  Patient identified by MRN, date of birth, ID band Patient awake    Reviewed: Allergy & Precautions, NPO status , Patient's Chart, lab work & pertinent test results  Airway Mallampati: I  TM Distance: >3 FB Neck ROM: Full    Dental  (+) Edentulous Upper, Edentulous Lower   Pulmonary neg pulmonary ROS   breath sounds clear to auscultation       Cardiovascular hypertension, Pt. on medications + dysrhythmias Atrial Fibrillation  Rhythm:Regular Rate:Normal  Echo: 1. Left ventricular ejection fraction, by estimation, is 60 to 65%. The  left ventricle has normal function. The left ventricle has no regional  wall motion abnormalities. There is mild left ventricular hypertrophy.  Left ventricular diastolic parameters  were normal.   2. Right ventricular systolic function is normal. The right ventricular  size is normal. There is normal pulmonary artery systolic pressure. The  estimated right ventricular systolic pressure is 18.8 mmHg.   3. The mitral valve is normal in structure. Trivial mitral valve  regurgitation. No evidence of mitral stenosis.   4. The aortic valve is tricuspid. Aortic valve regurgitation is mild.  Aortic valve sclerosis is present, with no evidence of aortic valve  stenosis.   5. The inferior vena cava is normal in size with greater than 50%  respiratory variability, suggesting right atrial pressure of 3 mmHg.      Neuro/Psych  PSYCHIATRIC DISORDERS  Depression    negative neurological ROS     GI/Hepatic negative GI ROS, Neg liver ROS,,,  Endo/Other  Hypothyroidism    Renal/GU negative Renal ROS     Musculoskeletal negative musculoskeletal ROS (+)    Abdominal   Peds  Hematology negative hematology ROS (+)   Anesthesia Other Findings   Reproductive/Obstetrics                             Anesthesia Physical Anesthesia Plan  ASA: 3  Anesthesia  Plan: General   Post-op Pain Management:    Induction: Intravenous  PONV Risk Score and Plan: 4 or greater and Ondansetron and Treatment may vary due to age or medical condition  Airway Management Planned: Oral ETT  Additional Equipment: None  Intra-op Plan:   Post-operative Plan: Extubation in OR  Informed Consent: I have reviewed the patients History and Physical, chart, labs and discussed the procedure including the risks, benefits and alternatives for the proposed anesthesia with the patient or authorized representative who has indicated his/her understanding and acceptance.       Plan Discussed with: CRNA  Anesthesia Plan Comments:        Anesthesia Quick Evaluation

## 2023-05-05 NOTE — Anesthesia Postprocedure Evaluation (Signed)
Anesthesia Post Note  Patient: [name]  Procedure(s) Performed: ATRIAL FIBRILLATION ABLATION     Patient location during evaluation: PACU Anesthesia Type: General Level of consciousness: awake and alert Pain management: pain level controlled Vital Signs Assessment: post-procedure vital signs reviewed and stable Respiratory status: spontaneous breathing, nonlabored ventilation, respiratory function stable and patient connected to nasal cannula oxygen Cardiovascular status: blood pressure returned to baseline and stable Postop Assessment: no apparent nausea or vomiting Anesthetic complications: no  No notable events documented.  Last Vitals:  Vitals:   05/05/23 1530 05/05/23 1600  BP: (!) 158/78 (!) 150/69  Pulse: (!) 48 (!) 48  Resp: 13 13  Temp:    SpO2: 100% 100%    Last Pain:  Vitals:   05/05/23 1600  PainSc: 1                  Shelton Silvas

## 2023-05-05 NOTE — Anesthesia Procedure Notes (Signed)
Procedure Name: Intubation Date/Time: 05/05/2023 11:31 AM  Performed by: Nils Pyle, CRNAPre-anesthesia Checklist: Patient identified, Emergency Drugs available, Suction available and Patient being monitored Patient Re-evaluated:Patient Re-evaluated prior to induction Oxygen Delivery Method: Circle System Utilized Preoxygenation: Pre-oxygenation with 100% oxygen Induction Type: IV induction Ventilation: Mask ventilation without difficulty Laryngoscope Size: Miller and 2 Grade View: Grade I Tube type: Oral Tube size: 7.0 mm Number of attempts: 1 Airway Equipment and Method: Stylet and Oral airway Placement Confirmation: ETT inserted through vocal cords under direct vision, positive ETCO2 and breath sounds checked- equal and bilateral Secured at: 22 cm Tube secured with: Tape Dental Injury: Teeth and Oropharynx as per pre-operative assessment

## 2023-05-05 NOTE — H&P (Signed)
Electrophysiology Office Note:    Date:  05/05/2023   ID:  Alexis Ochoa, DOB [birth year], MRN [id number]  PCP:  Laurann Montana, MD   [facility] Providers Cardiologist:  Bryan Lemma, MD Electrophysiologist:  Maurice Small, MD     Referring MD: No ref. provider found   History of Present Illness:    Alexis Ochoa is a 72 y.o. female with a hx listed below, significant for HTN, atrial fibrillation, thyroid disease, syncope referred for arrhythmia management.  She had an episode of syncope in December.  She had just gotten up and was walking around when she felt tunnel vision and then woke up on the floor.  She notes that she had a GI illness a few days prior to syncope and she was likely dehydrated.  She was diagnosed with AF Recently. Her rates in AF are extremely fast, up to 220 bpm. In sinus rhythm, rates are quite slow, her tachy-brady limiting her options for rate control.   She was prescribed Eliquis.  She did not begin the Eliquis initially because she was having teeth extracted for denture fitting.  She did start the Eliquis about 5 days after the extraction but began to have bleeding resulting in an ER visit.  She has held Eliquis since and will resume it after she has her extraction sites evaluated.  I reviewed the patient's CT and labs. There was no LAA thrombus. she  has not missed any doses of anticoagulation, and she took her dose last night. There have been no changes in the patient's diagnoses, medications, or condition since our recent clinic visit.    Past Medical History:  Diagnosis Date   History of blood transfusion 1970; 04/28/2017   "related to childbirth; related to anemia" (04/28/2017 - admitted with Pancytopenia - Dx Pernicious Anemia - Vit B12 deficiency)   Hypertension    Hypothyroidism    On levothyroxine   Iron deficiency anemia 04/28/2017   PAF (paroxysmal atrial fibrillation) (HCC) 09/2022   Complicated by sinus bradycardia    Pancytopenia (HCC) 04/28/2017   Pernicious anemia    Situational depression    "when husband was real sick; when adopting grandchildren"    Past Surgical History:  Procedure Laterality Date   48 HR HOLTER MONITOR  05/2018   Sinus Bradycardia (35 @ ~2 AM & 7 AM) / Sinus Rhythm / Sinus Arrhythmia. Max HR 94 ( Avg 54)PVC x 1. PAC's noted. NO symptoms reported on diary. Short 2-4 beat runs of PACs   ABDOMINAL HYSTERECTOMY  ~ 1991-1992   "still have my ovaries"   EXPLORATORY LAPAROTOMY  1980s   "before hysterectomy"   GRADED EXERCISE TOLERANCE TEST  05/2018   HTN response to exercise. No EKG changes c/w ischemia. 9:00 min - 10.1 METS. Peak HR 136 bpm (8% MPHR of 154 bpm).  BP increased from 192/80 to 210/139 mHg.   LAPAROSCOPIC CHOLECYSTECTOMY  11/2013    Current Medications: Current Meds  Medication Sig   apixaban (ELIQUIS) 5 MG TABS tablet Take 1 tablet (5 mg total) by mouth 2 (two) times daily.   Ascorbic Acid (VITAMIN C WITH ROSE HIPS) 500 MG tablet Take 500 mg by mouth daily.   cholecalciferol (VITAMIN D) 1000 UNITS tablet Take 1,000 Units by mouth at bedtime.    cyanocobalamin (VITAMIN B12) 1000 MCG/ML injection Inject 1,000 mcg into the muscle every 30 (thirty) days.   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  levothyroxine (SYNTHROID) 88 MCG tablet Take 88 mcg by mouth daily before breakfast.   valsartan-hydrochlorothiazide (DIOVAN-HCT) 320-12.5 MG tablet Take 1 tablet by mouth daily.     Allergies:   Patient has no known allergies.   Social and Family History: Reviewed in Epic  ROS:   Please see the history of present illness.    All other systems reviewed and are negative.  EKGs/Labs/Other Studies Reviewed Today:    Echocardiogram:  TTE 12/24/2021 EF 60-65%. Normal LA size. Normal structure   Monitors:  Zio patch 14d 10/24/2022 Sinus rhythm HR 37-132, average 55 bpm Multiple atrial high rate episodes appear  to be AF AF burden 1%. No pauses reported Frequent Atrial ectopy, 7.4%  Stress testing:  2019 - 85% maximum heart rate. No ST changes    Advanced imaging:     EKG:  Last EKG results: today - sinus rhythm with aberrantly conducted PACs occurring in bigeminy.  Right bundle branch block, left anterior fascicular block.   Recent Labs: 04/21/2023: BUN 11; Creatinine, Ser 0.84; Hemoglobin 12.1; Platelets 179; Potassium 3.6; Sodium 132     Physical Exam:    VS:  BP (!) 173/65   Pulse (!) 47   Temp (!) 97.3 F (36.3 C)   Resp 16   Ht 5' (1.524 m)   Wt 59 kg   SpO2 99%   BMI 25.39 kg/m     Wt Readings from Last 3 Encounters:  05/05/23 59 kg  03/30/23 58.8 kg  02/10/23 57.1 kg     GEN: Well nourished, well developed in no acute distress CARDIAC: RRR, no murmurs, rubs, gallops RESPIRATORY:  Normal work of breathing MUSCULOSKELETAL: no edema    ASSESSMENT & PLAN:    Paroxysmal atrial fibrillation Very rapid rates in AF Asymptomatic -- no palpitations, overt fatigue.  However, episodes have been very brief.  I suspect she would be highly symptomatic if persistent and likely developed heart failure given her rates. Rate control is not an option due to baseline bradycardia. I recommended attempting rhythm control prior to consideration of pacemaker.  We discussed options including Tikosyn versus early invasive strategy.  She would prefer an early invasive strategy. Also, given that she is asymptomatic and has extremely high rates, I think we need to monitor her atrial fibrillation closely.  I recommended loop recorder placement. I explained the logistics and rationale of the procedure, the process for monitoring and monthly fee. She is here today for placement of the device  Tachy-brady syndrome Monitor with loop recorder  Hypercoagulable state due to atrial fibrillation Eliqus 5 mg She had bleeding when she started Eliquis a few days after dental extraction. She has an  upcoming dental surgery. She may hold eliquis for 48 hours prior to the surgery  Syncope and collapse Suspect dehydration. Cannot exclude exacerbation by extreme tachycardia and A-fib, or postconversion pause. Monitor with loop recorder           Signed, Maurice Small, MD  05/05/2023 11:09 AM    [facility]

## 2023-05-05 NOTE — Transfer of Care (Signed)
Immediate Anesthesia Transfer of Care Note  Patient: Alexis Ochoa  Procedure(s) Performed: ATRIAL FIBRILLATION ABLATION  Patient Location: Cath Lab  Anesthesia Type:General  Level of Consciousness: awake and alert   Airway & Oxygen Therapy: Patient Spontanous Breathing  Post-op Assessment: Report given to RN, Post -op Vital signs reviewed and stable, and Patient moving all extremities X 4  Post vital signs: Reviewed and stable  Last Vitals:  Vitals Value Taken Time  BP 151/68 05/05/23 1338  Temp    Pulse 54 05/05/23 1340  Resp 11 05/05/23 1340  SpO2 100 % 05/05/23 1340  Vitals shown include unfiled device data.  Last Pain:  Vitals:   05/05/23 0905  PainSc: 0-No pain         Complications: No notable events documented.

## 2023-05-06 ENCOUNTER — Encounter (HOSPITAL_COMMUNITY): Payer: Self-pay | Admitting: Cardiovascular Disease

## 2023-05-06 ENCOUNTER — Telehealth: Payer: Self-pay | Admitting: Internal Medicine

## 2023-05-06 LAB — POCT ACTIVATED CLOTTING TIME: Activated Clotting Time: 477 seconds

## 2023-05-06 NOTE — Telephone Encounter (Signed)
Patient called in with complaint of bleed through noted on the gauze covering her right sided access site. She is s/p ablation on 7/17. Advised to her change dressing with gauze and medical tape that she had at home. She was able to do this without issue. Access site with mild bruising and no active bleeding after holding pressure and lying flat for an hour. Anticipatory guidance and ED precautions given.

## 2023-05-12 ENCOUNTER — Encounter: Payer: Self-pay | Admitting: Cardiovascular Disease

## 2023-05-14 DIAGNOSIS — E538 Deficiency of other specified B group vitamins: Secondary | ICD-10-CM | POA: Diagnosis not present

## 2023-05-17 ENCOUNTER — Ambulatory Visit (INDEPENDENT_AMBULATORY_CARE_PROVIDER_SITE_OTHER): Payer: PPO

## 2023-05-17 DIAGNOSIS — I480 Paroxysmal atrial fibrillation: Secondary | ICD-10-CM

## 2023-05-27 ENCOUNTER — Other Ambulatory Visit: Payer: Self-pay

## 2023-06-02 ENCOUNTER — Encounter (HOSPITAL_COMMUNITY): Payer: Self-pay | Admitting: Physician Assistant

## 2023-06-02 ENCOUNTER — Ambulatory Visit (HOSPITAL_COMMUNITY)
Admission: RE | Admit: 2023-06-02 | Discharge: 2023-06-02 | Disposition: A | Discharge status: Home / Self Care | Payer: PPO | Source: Ambulatory Visit | Attending: Physician Assistant | Admitting: Physician Assistant

## 2023-06-02 VITALS — BP 142/82 | HR 50 | Ht 60.0 in | Wt 129.4 lb

## 2023-06-02 DIAGNOSIS — I10 Essential (primary) hypertension: Secondary | ICD-10-CM | POA: Insufficient documentation

## 2023-06-02 DIAGNOSIS — I480 Paroxysmal atrial fibrillation: Secondary | ICD-10-CM | POA: Insufficient documentation

## 2023-06-02 DIAGNOSIS — D6869 Other thrombophilia: Secondary | ICD-10-CM | POA: Insufficient documentation

## 2023-06-02 DIAGNOSIS — I4891 Unspecified atrial fibrillation: Secondary | ICD-10-CM | POA: Diagnosis present

## 2023-06-02 DIAGNOSIS — R9431 Abnormal electrocardiogram [ECG] [EKG]: Secondary | ICD-10-CM | POA: Diagnosis not present

## 2023-06-02 DIAGNOSIS — I4510 Unspecified right bundle-branch block: Secondary | ICD-10-CM | POA: Insufficient documentation

## 2023-06-02 NOTE — Progress Notes (Signed)
Primary Care Physician: Laurann Montana, MD Primary Cardiologist: Bryan Lemma, MD Electrophysiologist: Maurice Small, MD  Referring Physician: Dr Nelly Laurence    Alexis Ochoa is a 72 y.o. female with a history of HTN, hypothyroidism, syncope, atrial fibrillation who presents for follow up in the [facility].  The patient was initially diagnosed with atrial fibrillation 10/2022 on a cardiac monitor. She was seen by Dr Nelly Laurence and given her very rapid rates in afib, she underwent afib ablation on 05/05/23. ILR was placed for afib monitoring on 01/29/23. Patient is on Eliquis for a CHADS2VASC score of 3.  On follow up today, patient reports that she has done well post ablation. Her ILR shows 0% afib burden. She denies chest pain, swallowing pain, or groin issues.   Today, she denies symptoms of palpitations, chest pain, shortness of breath, orthopnea, PND, lower extremity edema, dizziness, presyncope, syncope, snoring, daytime somnolence, bleeding, or neurologic sequela. The patient is tolerating medications without difficulties and is otherwise without complaint today.    Atrial Fibrillation Risk Factors:  she does not have symptoms or diagnosis of sleep apnea. she does not have a history of rheumatic fever.   Atrial Fibrillation Management history:  Previous antiarrhythmic drugs: none Previous cardioversions: none Previous ablations: 05/05/23 Anticoagulation history: Eliquis  ROS- All systems are reviewed and negative except as per the HPI above.  Past Medical History:  Diagnosis Date   History of blood transfusion 1970; 04/28/2017   "related to childbirth; related to anemia" (04/28/2017 - admitted with Pancytopenia - Dx Pernicious Anemia - Vit B12 deficiency)   Hypertension    Hypothyroidism    On levothyroxine   Iron deficiency anemia 04/28/2017   PAF (paroxysmal atrial fibrillation) (HCC) 09/2022   Complicated by sinus bradycardia   Pancytopenia  (HCC) 04/28/2017   Pernicious anemia    Situational depression    "when husband was real sick; when adopting grandchildren"    Current Outpatient Medications  Medication Sig Dispense Refill   apixaban (ELIQUIS) 5 MG TABS tablet Take 1 tablet (5 mg total) by mouth 2 (two) times daily. 180 tablet 2   Ascorbic Acid (VITAMIN C WITH ROSE HIPS) 500 MG tablet Take 500 mg by mouth daily.     cholecalciferol (VITAMIN D) 1000 UNITS tablet Take 1,000 Units by mouth at bedtime.      cyanocobalamin (VITAMIN B12) 1000 MCG/ML injection Inject 1,000 mcg into the muscle every 30 (thirty) days.     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     levothyroxine (SYNTHROID) 88 MCG tablet Take 88 mcg by mouth daily before breakfast.     pantoprazole (PROTONIX) 40 MG tablet Take 1 tablet (40 mg total) by mouth daily. 30 tablet 0   valsartan-hydrochlorothiazide (DIOVAN-HCT) 320-12.5 MG tablet Take 1 tablet by mouth daily.     colchicine 0.6 MG tablet Take 1 tablet (0.6 mg total) by mouth 2 (two) times daily for 5 days. 10 tablet 0   No current facility-administered medications for this encounter.    Physical Exam: BP (!) 142/82   Pulse (!) 50   Ht 5' (1.524 m)   Wt 58.7 kg   BMI 25.27 kg/m   GEN: Well nourished, well developed in no acute distress NECK: No JVD; No carotid bruits CARDIAC: Regular rate and rhythm, no murmurs, rubs, gallops RESPIRATORY:  Clear to auscultation without rales, wheezing or rhonchi  ABDOMEN: Soft, non-tender, non-distended EXTREMITIES:  No edema; No  deformity   Wt Readings from Last 3 Encounters:  06/02/23 58.7 kg  05/05/23 59 kg  03/30/23 58.8 kg     EKG today demonstrates  SB, RBBB, LPFB Vent. rate 50 BPM PR interval 114 ms QRS duration 126 ms QT/QTcB 488/444 ms  Echo 12/25/22 demonstrated   1. Left ventricular ejection fraction, by estimation, is 60 to 65%. The  left ventricle has normal function. The left  ventricle has no regional  wall motion abnormalities. There is mild left ventricular hypertrophy.  Left ventricular diastolic parameters were normal.   2. Right ventricular systolic function is normal. The right ventricular  size is normal. There is normal pulmonary artery systolic pressure. The  estimated right ventricular systolic pressure is 18.8 mmHg.   3. The mitral valve is normal in structure. Trivial mitral valve  regurgitation. No evidence of mitral stenosis.   4. The aortic valve is tricuspid. Aortic valve regurgitation is mild.  Aortic valve sclerosis is present, with no evidence of aortic valve  stenosis.   5. The inferior vena cava is normal in size with greater than 50%  respiratory variability, suggesting right atrial pressure of 3 mmHg.    CHA2DS2-VASc Score = 3  The patient's score is based upon: CHF History: 0 HTN History: 1 Diabetes History: 0 Stroke History: 0 Vascular Disease History: 0 Age Score: 1 Gender Score: 1       ASSESSMENT AND PLAN: Paroxysmal Atrial Fibrillation (ICD10:  I48.0) The patient's CHA2DS2-VASc score is 3, indicating a 3.2% annual risk of stroke.   S/p afib ablation 05/05/23 ILR shows 0% afib burden Continue Eliquis 5 mg BID with no missed doses for 3 months post ablation.  Secondary Hypercoagulable State (ICD10:  D68.69) The patient is at significant risk for stroke/thromboembolism based upon her CHA2DS2-VASc Score of 3.  Continue Apixaban (Eliquis).   HTN Stable on current regimen   Follow up with Dr Nelly Laurence as scheduled.        Jorja Loa PA-C [facility] [address] [place], [place] [address] [phone]

## 2023-06-03 NOTE — Progress Notes (Signed)
Carelink Summary Report / Loop Recorder 

## 2023-06-16 DIAGNOSIS — E538 Deficiency of other specified B group vitamins: Secondary | ICD-10-CM | POA: Diagnosis not present

## 2023-06-17 LAB — CUP PACEART REMOTE DEVICE CHECK
Date Time Interrogation Session: 20240828231251
Implantable Pulse Generator Implant Date: 20240412

## 2023-06-22 ENCOUNTER — Ambulatory Visit (INDEPENDENT_AMBULATORY_CARE_PROVIDER_SITE_OTHER): Payer: PPO

## 2023-06-22 DIAGNOSIS — R55 Syncope and collapse: Secondary | ICD-10-CM | POA: Diagnosis not present

## 2023-07-05 NOTE — Progress Notes (Signed)
Carelink Summary Report / Loop Recorder 

## 2023-07-19 DIAGNOSIS — E538 Deficiency of other specified B group vitamins: Secondary | ICD-10-CM | POA: Diagnosis not present

## 2023-07-21 LAB — CUP PACEART REMOTE DEVICE CHECK
Date Time Interrogation Session: 20240930231538
Implantable Pulse Generator Implant Date: 20240412

## 2023-07-26 ENCOUNTER — Ambulatory Visit: Payer: PPO

## 2023-07-26 DIAGNOSIS — R55 Syncope and collapse: Secondary | ICD-10-CM

## 2023-08-06 ENCOUNTER — Ambulatory Visit: Payer: PPO | Attending: Cardiovascular Disease | Admitting: Cardiovascular Disease

## 2023-08-06 ENCOUNTER — Encounter: Payer: Self-pay | Admitting: Cardiovascular Disease

## 2023-08-06 VITALS — BP 146/68 | HR 52 | Ht 61.0 in | Wt 127.8 lb

## 2023-08-06 DIAGNOSIS — I480 Paroxysmal atrial fibrillation: Secondary | ICD-10-CM | POA: Diagnosis not present

## 2023-08-06 NOTE — Patient Instructions (Addendum)
Medication Instructions:  STOP Eliquis *If you need a refill on your cardiac medications before your next appointment, please call your pharmacy*   Follow-Up: At [facility], you and your health needs are our priority.  As part of our continuing mission to provide you with exceptional heart care, we have created designated Provider Care Teams.  These Care Teams include your primary Cardiologist (physician) and Advanced Practice Providers (APPs -  Physician Assistants and Nurse Practitioners) who all work together to provide you with the care you need, when you need it.  We recommend signing up for the patient portal called "MyChart".  Sign up information is provided on this After Visit Summary.  MyChart is used to connect with patients for Virtual Visits (Telemedicine).  Patients are able to view lab/test results, encounter notes, upcoming appointments, etc.  Non-urgent messages can be sent to your provider as well.   To learn more about what you can do with MyChart, go to [email or web address].    Your next appointment:   6 month(s)  Provider:   York Pellant, MD

## 2023-08-06 NOTE — Progress Notes (Signed)
Electrophysiology Office Note:    Date:  08/06/2023   ID:  Alexis Ochoa, DOB 24-Mar-1951, MRN [id number]  PCP:  Laurann Montana, MD   [facility] Providers Cardiologist:  Bryan Lemma, MD Electrophysiologist:  Maurice Small, MD     Referring MD: Laurann Montana, MD   History of Present Illness:    Alexis Ochoa is a 72 y.o. female with a hx listed below, significant for HTN, atrial fibrillation, thyroid disease, syncope referred for arrhythmia management.  She had an episode of syncope in December.  She had just gotten up and was walking around when she felt tunnel vision and then woke up on the floor.  She notes that she had a GI illness a few days prior to syncope and she was likely dehydrated.  Her rates in AF have been extremely fast, up to 220 bpm. In sinus rhythm, rates are quite slow, her tachy-brady limiting her options for rate control.   She was prescribed Eliquis.       EKGs/Labs/Other Studies Reviewed Today:    Echocardiogram:  TTE 12/24/2021 EF 60-65%. Normal LA size. Normal structure   Monitors:  Zio patch 14d 10/24/2022 Sinus rhythm HR 37-132, average 55 bpm Multiple atrial high rate episodes appear to be AF AF burden 1%. No pauses reported Frequent Atrial ectopy, 7.4%  Stress testing:  2019 - 85% maximum heart rate. No ST changes    Advanced imaging:     EKG:  Last EKG results: today - sinus rhythm with aberrantly conducted PACs occurring in bigeminy.  Right bundle branch block, left anterior fascicular block.   Recent Labs: 04/21/2023: BUN 11; Creatinine, Ser 0.84; Hemoglobin 12.1; Platelets 179; Potassium 3.6; Sodium 132     Physical Exam:    VS:  BP (!) 146/68   Pulse (!) 52   Ht 5\' 1"  (1.549 m)   Wt 127 lb 12.8 oz (58 kg)   SpO2 99%   BMI 24.15 kg/m     Wt Readings from Last 3 Encounters:  08/06/23 127 lb 12.8 oz (58 kg)  06/02/23 129 lb 6.4 oz (58.7 kg)  05/05/23 130 lb (59 kg)     GEN: Well nourished, well  developed in no acute distress CARDIAC: RRR, no murmurs, rubs, gallops RESPIRATORY:  Normal work of breathing MUSCULOSKELETAL: no edema    ASSESSMENT & PLAN:    Paroxysmal atrial fibrillation Very rapid rates in AF Asymptomatic --no palpitations, overt fatigue.  However, episodes have been very brief.  I suspect she would be highly symptomatic if persistent and likely developed heart failure given her rates. Rate control is not an option due to baseline bradycardia. S/p ablatio of AF July 2024 No AF detected on her loop recorder since placement   Tachy-brady syndrome Monitor with loop recorder  Hypercoagulable state due to atrial fibrillation Because she is no longer having A-fib after the ablation, I think the risk-benefit calculation favors discontinuing Eliquis We will continue to monitor with her loop recorder.  Syncope and collapse Suspect dehydration. Cannot exclude exacerbation by extreme tachycardia and A-fib, or postconversion pause. Monitor with loop recorder         Medication Adjustments/Labs and Tests Ordered: Current medicines are reviewed at length with the patient today.  Concerns regarding medicines are outlined above.  Orders Placed This Encounter  Procedures   EKG 12-Lead   No orders of the defined types were placed in this encounter.          Signed, Roberts Gaudy  Tiye Huwe, MD  08/06/2023 11:13 AM    [facility]

## 2023-08-12 NOTE — Progress Notes (Signed)
Carelink Summary Report / Loop Recorder 

## 2023-08-18 DIAGNOSIS — E538 Deficiency of other specified B group vitamins: Secondary | ICD-10-CM | POA: Diagnosis not present

## 2023-08-25 LAB — CUP PACEART REMOTE DEVICE CHECK
Date Time Interrogation Session: 20241102230940
Implantable Pulse Generator Implant Date: 20240412

## 2023-08-30 ENCOUNTER — Ambulatory Visit (INDEPENDENT_AMBULATORY_CARE_PROVIDER_SITE_OTHER): Payer: PPO

## 2023-08-30 DIAGNOSIS — I480 Paroxysmal atrial fibrillation: Secondary | ICD-10-CM

## 2023-08-30 DIAGNOSIS — R55 Syncope and collapse: Secondary | ICD-10-CM

## 2023-09-15 DIAGNOSIS — E538 Deficiency of other specified B group vitamins: Secondary | ICD-10-CM | POA: Diagnosis not present

## 2023-09-27 NOTE — Progress Notes (Signed)
Carelink Summary Report / Loop Recorder 

## 2023-10-04 ENCOUNTER — Ambulatory Visit: Payer: PPO

## 2023-10-04 DIAGNOSIS — R55 Syncope and collapse: Secondary | ICD-10-CM

## 2023-10-04 DIAGNOSIS — I480 Paroxysmal atrial fibrillation: Secondary | ICD-10-CM

## 2023-10-04 LAB — CUP PACEART REMOTE DEVICE CHECK
Date Time Interrogation Session: 20241215231604
Implantable Pulse Generator Implant Date: 20240412

## 2023-10-15 DIAGNOSIS — E039 Hypothyroidism, unspecified: Secondary | ICD-10-CM | POA: Diagnosis not present

## 2023-10-15 DIAGNOSIS — I480 Paroxysmal atrial fibrillation: Secondary | ICD-10-CM | POA: Diagnosis not present

## 2023-10-15 DIAGNOSIS — E538 Deficiency of other specified B group vitamins: Secondary | ICD-10-CM | POA: Diagnosis not present

## 2023-10-15 DIAGNOSIS — I495 Sick sinus syndrome: Secondary | ICD-10-CM | POA: Diagnosis not present

## 2023-10-15 DIAGNOSIS — I10 Essential (primary) hypertension: Secondary | ICD-10-CM | POA: Diagnosis not present

## 2023-10-15 DIAGNOSIS — M25561 Pain in right knee: Secondary | ICD-10-CM | POA: Diagnosis not present

## 2023-10-15 DIAGNOSIS — D6869 Other thrombophilia: Secondary | ICD-10-CM | POA: Diagnosis not present

## 2023-11-02 ENCOUNTER — Ambulatory Visit
Admission: RE | Admit: 2023-11-02 | Discharge: 2023-11-02 | Disposition: A | Discharge status: Home / Self Care | Payer: PPO | Source: Ambulatory Visit | Attending: Family Medicine | Admitting: Family Medicine

## 2023-11-02 DIAGNOSIS — Z139 Encounter for screening, unspecified: Secondary | ICD-10-CM

## 2023-11-02 DIAGNOSIS — M8580 Other specified disorders of bone density and structure, unspecified site: Secondary | ICD-10-CM

## 2023-11-08 ENCOUNTER — Ambulatory Visit (INDEPENDENT_AMBULATORY_CARE_PROVIDER_SITE_OTHER): Payer: PPO

## 2023-11-08 DIAGNOSIS — I480 Paroxysmal atrial fibrillation: Secondary | ICD-10-CM

## 2023-11-08 LAB — CUP PACEART REMOTE DEVICE CHECK
Date Time Interrogation Session: 20250119232219
Implantable Pulse Generator Implant Date: 20240412

## 2023-11-11 NOTE — Progress Notes (Signed)
Carelink Summary Report / Loop Recorder 

## 2023-11-13 ENCOUNTER — Encounter: Payer: Self-pay | Admitting: Cardiovascular Disease

## 2023-11-25 DIAGNOSIS — M810 Age-related osteoporosis without current pathological fracture: Secondary | ICD-10-CM | POA: Diagnosis not present

## 2023-12-13 ENCOUNTER — Ambulatory Visit (INDEPENDENT_AMBULATORY_CARE_PROVIDER_SITE_OTHER): Payer: PPO

## 2023-12-13 DIAGNOSIS — R55 Syncope and collapse: Secondary | ICD-10-CM | POA: Diagnosis not present

## 2023-12-13 DIAGNOSIS — I480 Paroxysmal atrial fibrillation: Secondary | ICD-10-CM

## 2023-12-15 LAB — CUP PACEART REMOTE DEVICE CHECK
Date Time Interrogation Session: 20250223231801
Implantable Pulse Generator Implant Date: 20240412

## 2023-12-17 NOTE — Progress Notes (Signed)
 Carelink Summary Report / Loop Recorder

## 2023-12-21 ENCOUNTER — Encounter: Payer: Self-pay | Admitting: Cardiovascular Disease

## 2023-12-28 DIAGNOSIS — M810 Age-related osteoporosis without current pathological fracture: Secondary | ICD-10-CM | POA: Diagnosis not present

## 2023-12-28 DIAGNOSIS — M25561 Pain in right knee: Secondary | ICD-10-CM | POA: Diagnosis not present

## 2024-01-03 DIAGNOSIS — M810 Age-related osteoporosis without current pathological fracture: Secondary | ICD-10-CM | POA: Diagnosis not present

## 2024-01-14 DIAGNOSIS — M25561 Pain in right knee: Secondary | ICD-10-CM | POA: Diagnosis not present

## 2024-01-14 DIAGNOSIS — E538 Deficiency of other specified B group vitamins: Secondary | ICD-10-CM | POA: Diagnosis not present

## 2024-01-14 DIAGNOSIS — M810 Age-related osteoporosis without current pathological fracture: Secondary | ICD-10-CM | POA: Diagnosis not present

## 2024-01-17 ENCOUNTER — Ambulatory Visit (INDEPENDENT_AMBULATORY_CARE_PROVIDER_SITE_OTHER): Payer: PPO

## 2024-01-17 DIAGNOSIS — R55 Syncope and collapse: Secondary | ICD-10-CM | POA: Diagnosis not present

## 2024-01-17 DIAGNOSIS — I480 Paroxysmal atrial fibrillation: Secondary | ICD-10-CM

## 2024-01-17 LAB — CUP PACEART REMOTE DEVICE CHECK
Date Time Interrogation Session: 20250330231613
Implantable Pulse Generator Implant Date: 20240412

## 2024-01-19 NOTE — Progress Notes (Signed)
 Carelink Summary Report / Loop Recorder

## 2024-01-19 NOTE — Addendum Note (Signed)
 Addended by: Geralyn Flash D on: 01/19/2024 02:24 PM   Modules accepted: Orders

## 2024-01-27 ENCOUNTER — Encounter: Payer: Self-pay | Admitting: Cardiovascular Disease

## 2024-01-28 DIAGNOSIS — M810 Age-related osteoporosis without current pathological fracture: Secondary | ICD-10-CM | POA: Diagnosis not present

## 2024-02-14 DIAGNOSIS — E538 Deficiency of other specified B group vitamins: Secondary | ICD-10-CM | POA: Diagnosis not present

## 2024-02-21 ENCOUNTER — Ambulatory Visit (INDEPENDENT_AMBULATORY_CARE_PROVIDER_SITE_OTHER): Payer: PPO

## 2024-02-21 DIAGNOSIS — R55 Syncope and collapse: Secondary | ICD-10-CM

## 2024-02-21 LAB — CUP PACEART REMOTE DEVICE CHECK
Date Time Interrogation Session: 20250504232614
Implantable Pulse Generator Implant Date: 20240412

## 2024-02-29 ENCOUNTER — Ambulatory Visit: Payer: Self-pay | Admitting: Cardiovascular Disease

## 2024-03-03 NOTE — Progress Notes (Signed)
 Carelink Summary Report / Loop Recorder

## 2024-03-14 DIAGNOSIS — E538 Deficiency of other specified B group vitamins: Secondary | ICD-10-CM | POA: Diagnosis not present

## 2024-03-23 ENCOUNTER — Ambulatory Visit (INDEPENDENT_AMBULATORY_CARE_PROVIDER_SITE_OTHER): Payer: Self-pay

## 2024-03-23 DIAGNOSIS — R001 Bradycardia, unspecified: Secondary | ICD-10-CM | POA: Diagnosis not present

## 2024-03-23 LAB — CUP PACEART REMOTE DEVICE CHECK
Date Time Interrogation Session: 20250604232343
Implantable Pulse Generator Implant Date: 20240412

## 2024-03-24 ENCOUNTER — Ambulatory Visit: Payer: Self-pay | Admitting: Cardiovascular Disease

## 2024-04-06 DIAGNOSIS — I10 Essential (primary) hypertension: Secondary | ICD-10-CM | POA: Diagnosis not present

## 2024-04-06 DIAGNOSIS — I480 Paroxysmal atrial fibrillation: Secondary | ICD-10-CM | POA: Diagnosis not present

## 2024-04-10 ENCOUNTER — Encounter: Payer: Self-pay | Admitting: Cardiovascular Disease

## 2024-04-10 ENCOUNTER — Ambulatory Visit: Attending: Cardiovascular Disease | Admitting: Cardiovascular Disease

## 2024-04-10 VITALS — BP 136/66 | HR 59 | Ht 61.0 in | Wt 133.2 lb

## 2024-04-10 DIAGNOSIS — I480 Paroxysmal atrial fibrillation: Secondary | ICD-10-CM | POA: Diagnosis not present

## 2024-04-10 NOTE — Patient Instructions (Signed)

## 2024-04-10 NOTE — Progress Notes (Signed)
  Electrophysiology Office Note:    Date:  04/10/2024   ID:  Alexis Ochoa, DOB 18-May-1951, MRN [id number]  PCP:  Teresa Channel, MD   [facility] Providers Cardiologist:  Alm Clay, MD Electrophysiologist:  Eulas FORBES Furbish, MD     Referring MD: Teresa Channel, MD   History of Present Illness:    Alexis Ochoa is a 73 y.o. female with a hx listed below, significant for HTN, atrial fibrillation, thyroid  disease, syncope referred for arrhythmia management.  She had an episode of syncope in December.  She had just gotten up and was walking around when she felt tunnel vision and then woke up on the floor.  She notes that she had a GI illness a few days prior to syncope and she was likely dehydrated.  Her rates in AF have been extremely fast, up to 220 bpm. In sinus rhythm, rates are quite slow, her tachy-brady limiting her options for rate control.   She was prescribed Eliquis .       EKGs/Labs/Other Studies Reviewed Today:    Echocardiogram:  TTE 12/24/2021 EF 60-65%. Normal LA size. Normal structure   Monitors:  Zio patch 14d 10/24/2022 Sinus rhythm HR 37-132, average 55 bpm Multiple atrial high rate episodes appear to be AF AF burden 1%. No pauses reported Frequent Atrial ectopy, 7.4%  Stress testing:  2019 - 85% maximum heart rate. No ST changes    Advanced imaging:     EKG:         Recent Labs: 04/21/2023: BUN 11; Creatinine, Ser 0.84; Hemoglobin 12.1; Platelets 179; Potassium 3.6; Sodium 132     Physical Exam:    VS:  BP 136/66   Pulse (!) 59   Ht 5' 1 (1.549 m)   Wt 133 lb 3.2 oz (60.4 kg)   SpO2 97%   BMI 25.17 kg/m     Wt Readings from Last 3 Encounters:  04/10/24 133 lb 3.2 oz (60.4 kg)  08/06/23 127 lb 12.8 oz (58 kg)  06/02/23 129 lb 6.4 oz (58.7 kg)     GEN: Well nourished, well developed in no acute distress CARDIAC: RRR, no murmurs, rubs, gallops RESPIRATORY:  Normal work of breathing MUSCULOSKELETAL: no  edema    ASSESSMENT & PLAN:    Paroxysmal atrial fibrillation Very rapid rates in AF Asymptomatic --no palpitations, overt fatigue.  However, episodes have been very brief.  I suspect she would be highly symptomatic if persistent and likely developed heart failure given her rates. Rate control is not an option due to baseline bradycardia. S/p ablatio of AF July 2024 No AF detected on her loop recorder since placement   Tachy-brady syndrome Monitor with loop recorder  Hypercoagulable state due to atrial fibrillation CHA2DS2-VASc score is 3 Monitoring with ILR; no episodes lasting greater than an hour We will continue to monitor with her loop recorder.  Syncope and collapse Suspect dehydration. Cannot exclude exacerbation by extreme tachycardia and A-fib, or postconversion pause. Monitor with loop recorder         Medication Adjustments/Labs and Tests Ordered: Current medicines are reviewed at length with the patient today.  Concerns regarding medicines are outlined above.  Orders Placed This Encounter  Procedures   EKG 12-Lead   No orders of the defined types were placed in this encounter.          Signed, Eulas FORBES Furbish, MD  04/10/2024 10:42 AM    [facility]

## 2024-04-11 NOTE — Progress Notes (Signed)
 Carelink Summary Report / Loop Recorder

## 2024-04-14 DIAGNOSIS — E538 Deficiency of other specified B group vitamins: Secondary | ICD-10-CM | POA: Diagnosis not present

## 2024-04-17 DIAGNOSIS — E039 Hypothyroidism, unspecified: Secondary | ICD-10-CM | POA: Diagnosis not present

## 2024-04-17 DIAGNOSIS — I480 Paroxysmal atrial fibrillation: Secondary | ICD-10-CM | POA: Diagnosis not present

## 2024-04-17 DIAGNOSIS — M810 Age-related osteoporosis without current pathological fracture: Secondary | ICD-10-CM | POA: Diagnosis not present

## 2024-04-17 DIAGNOSIS — F3340 Major depressive disorder, recurrent, in remission, unspecified: Secondary | ICD-10-CM | POA: Diagnosis not present

## 2024-04-17 DIAGNOSIS — I10 Essential (primary) hypertension: Secondary | ICD-10-CM | POA: Diagnosis not present

## 2024-04-24 ENCOUNTER — Ambulatory Visit (INDEPENDENT_AMBULATORY_CARE_PROVIDER_SITE_OTHER): Payer: Self-pay

## 2024-04-24 DIAGNOSIS — R55 Syncope and collapse: Secondary | ICD-10-CM

## 2024-04-24 DIAGNOSIS — I480 Paroxysmal atrial fibrillation: Secondary | ICD-10-CM

## 2024-04-24 LAB — CUP PACEART REMOTE DEVICE CHECK
Date Time Interrogation Session: 20250706233605
Implantable Pulse Generator Implant Date: 20240412

## 2024-04-25 ENCOUNTER — Telehealth: Payer: Self-pay | Admitting: *Deleted

## 2024-04-25 ENCOUNTER — Ambulatory Visit: Payer: Self-pay | Admitting: Cardiovascular Disease

## 2024-04-25 DIAGNOSIS — M810 Age-related osteoporosis without current pathological fracture: Secondary | ICD-10-CM | POA: Diagnosis not present

## 2024-04-25 DIAGNOSIS — E039 Hypothyroidism, unspecified: Secondary | ICD-10-CM | POA: Diagnosis not present

## 2024-04-25 DIAGNOSIS — L900 Lichen sclerosus et atrophicus: Secondary | ICD-10-CM | POA: Diagnosis not present

## 2024-04-25 DIAGNOSIS — I10 Essential (primary) hypertension: Secondary | ICD-10-CM | POA: Diagnosis not present

## 2024-04-25 DIAGNOSIS — R0981 Nasal congestion: Secondary | ICD-10-CM | POA: Diagnosis not present

## 2024-04-25 DIAGNOSIS — I480 Paroxysmal atrial fibrillation: Secondary | ICD-10-CM | POA: Diagnosis not present

## 2024-04-25 DIAGNOSIS — I495 Sick sinus syndrome: Secondary | ICD-10-CM | POA: Diagnosis not present

## 2024-04-25 DIAGNOSIS — F3340 Major depressive disorder, recurrent, in remission, unspecified: Secondary | ICD-10-CM | POA: Diagnosis not present

## 2024-04-25 DIAGNOSIS — Z0000 Encounter for general adult medical examination without abnormal findings: Secondary | ICD-10-CM | POA: Diagnosis not present

## 2024-04-25 DIAGNOSIS — E559 Vitamin D deficiency, unspecified: Secondary | ICD-10-CM | POA: Diagnosis not present

## 2024-04-25 DIAGNOSIS — E538 Deficiency of other specified B group vitamins: Secondary | ICD-10-CM | POA: Diagnosis not present

## 2024-04-25 NOTE — Telephone Encounter (Signed)
 Alert received from [facility]:  ILR summary report received. Battery status OK. Normal device function. No new symptom, tachy, brady, or pause episodes. 1 new AF episode on 04/20/24, 2 hrs 54 min, EGM c/w atrial arrhythmia, median V rate 100 bpm, not on OAC per Epic; routed to clinic for review. Monthly summary reports and ROV/PRN. [facility], [facility] ________________________________________________________________________  AF ablation 05/05/23   ELIQUIS  stopped per AM, MD OV note 08/06/23  No BB or AA actively noted in pt's medication list  P waves with varying PR intervals appear to be visible intermittently   No P waves visible with chaotic baseline occurring at times    Atrial flutter waves appear present at times as well   Frequent PAC's and PCV's present appearing to cause rhythm irregularity   Called and spoke with patient who denied experiencing any symptoms at the time of episode  AF burden 0%   PVC burden 7.5%  Uncertain of actual AF diagnosis for this rhythm  Routing to AM, MD and RF, PA-C in AF clinic for review and advisement since pt off OAC

## 2024-04-25 NOTE — Telephone Encounter (Signed)
 Called patient and notified them of MD's response  Patient appreciative of phone call

## 2024-05-05 DIAGNOSIS — I480 Paroxysmal atrial fibrillation: Secondary | ICD-10-CM | POA: Diagnosis not present

## 2024-05-05 DIAGNOSIS — I10 Essential (primary) hypertension: Secondary | ICD-10-CM | POA: Diagnosis not present

## 2024-05-11 NOTE — Progress Notes (Signed)
 Carelink Summary Report / Loop Recorder

## 2024-05-18 DIAGNOSIS — I480 Paroxysmal atrial fibrillation: Secondary | ICD-10-CM | POA: Diagnosis not present

## 2024-05-18 DIAGNOSIS — F3340 Major depressive disorder, recurrent, in remission, unspecified: Secondary | ICD-10-CM | POA: Diagnosis not present

## 2024-05-18 DIAGNOSIS — E039 Hypothyroidism, unspecified: Secondary | ICD-10-CM | POA: Diagnosis not present

## 2024-05-18 DIAGNOSIS — I10 Essential (primary) hypertension: Secondary | ICD-10-CM | POA: Diagnosis not present

## 2024-05-18 DIAGNOSIS — M810 Age-related osteoporosis without current pathological fracture: Secondary | ICD-10-CM | POA: Diagnosis not present

## 2024-05-19 DIAGNOSIS — E559 Vitamin D deficiency, unspecified: Secondary | ICD-10-CM | POA: Diagnosis not present

## 2024-05-19 DIAGNOSIS — E039 Hypothyroidism, unspecified: Secondary | ICD-10-CM | POA: Diagnosis not present

## 2024-05-19 DIAGNOSIS — E538 Deficiency of other specified B group vitamins: Secondary | ICD-10-CM | POA: Diagnosis not present

## 2024-05-19 DIAGNOSIS — I10 Essential (primary) hypertension: Secondary | ICD-10-CM | POA: Diagnosis not present

## 2024-05-25 ENCOUNTER — Encounter (INDEPENDENT_AMBULATORY_CARE_PROVIDER_SITE_OTHER): Payer: Self-pay | Admitting: Otolaryngology

## 2024-05-25 ENCOUNTER — Ambulatory Visit (INDEPENDENT_AMBULATORY_CARE_PROVIDER_SITE_OTHER): Admitting: Otolaryngology

## 2024-05-25 ENCOUNTER — Ambulatory Visit (INDEPENDENT_AMBULATORY_CARE_PROVIDER_SITE_OTHER): Admitting: Audiology

## 2024-05-25 ENCOUNTER — Ambulatory Visit: Payer: Self-pay

## 2024-05-25 VITALS — BP 144/81 | HR 60 | Ht 61.0 in | Wt 130.0 lb

## 2024-05-25 DIAGNOSIS — H9071 Mixed conductive and sensorineural hearing loss, unilateral, right ear, with unrestricted hearing on the contralateral side: Secondary | ICD-10-CM | POA: Diagnosis not present

## 2024-05-25 DIAGNOSIS — H9011 Conductive hearing loss, unilateral, right ear, with unrestricted hearing on the contralateral side: Secondary | ICD-10-CM

## 2024-05-25 DIAGNOSIS — H90A31 Mixed conductive and sensorineural hearing loss, unilateral, right ear with restricted hearing on the contralateral side: Secondary | ICD-10-CM

## 2024-05-25 DIAGNOSIS — H9042 Sensorineural hearing loss, unilateral, left ear, with unrestricted hearing on the contralateral side: Secondary | ICD-10-CM

## 2024-05-25 DIAGNOSIS — H90A11 Conductive hearing loss, unilateral, right ear with restricted hearing on the contralateral side: Secondary | ICD-10-CM

## 2024-05-25 DIAGNOSIS — R001 Bradycardia, unspecified: Secondary | ICD-10-CM

## 2024-05-25 LAB — CUP PACEART REMOTE DEVICE CHECK
Date Time Interrogation Session: 20250806233058
Implantable Pulse Generator Implant Date: 20240412

## 2024-05-25 NOTE — Progress Notes (Signed)
  [address], [address] [place], [place] [address] [phone]  Audiological Evaluation    Name: Alexis Ochoa     DOB:   01/09/1951      MRN:   [id number]                                                                                     Service Date: 05/25/2024     Accompanied by: unaccompanied   Patient comes today after Dr. Karis, ENT sent a referral for a hearing evaluation due to concerns with tinnitus.   Symptoms Yes Details  Hearing loss  [x]  Right sided hearing loss about 2 years ago  Tinnitus  [x]  Sometimes in both ears  Ear pain/ infections/pressure  [x]  Right ear clears when she does Valsalva  Balance problems  []    Noise exposure history  []    Previous ear surgeries  []    Family history of hearing loss  []    Amplification  []    Other  []      Otoscopy: Right ear: Clear external ear canal and notable landmarks visualized on the tympanic membrane. Left ear:  Clear external ear canal and notable landmarks visualized on the tympanic membrane.  Tympanometry: Right ear: Type B- Normal external ear canal volume with no middle ear pressure peak or tympanic membrane compliance. Left ear: Type A- Normal external ear canal volume with normal middle ear pressure and tympanic membrane compliance.  Pure tone Audiometry: Right ear- Moderate to profound mixed hearing loss from 250 Hz - 8000 Hz. Left ear-  Normal to moderately severe sensorineural hearing loss from 250 Hz - 8000 Hz.  Speech Audiometry: Right ear- Speech Reception Threshold (SRT) was obtained at 55 dBHL, with contralateral masking  Left ear-Speech Reception Threshold (SRT) was obtained at 25 dBHL.   Word Recognition Score Tested using NU-6 (recorded) Right ear: 100% was obtained at a presentation level of 85 dBHL with contralateral masking which is deemed as  excellent. Left ear: 100% was obtained at a presentation level of 65 dBHL with contralateral masking which is deemed as  excellent.   The  hearing test results were completed under headphones and results are deemed to be of good reliability. Test technique:  conventional      Recommendations: Follow up with ENT as scheduled for today. Repeat audiogram after medical care.   Nyron Mozer MARIE LEROUX-MARTINEZ, AUD

## 2024-05-27 DIAGNOSIS — H9011 Conductive hearing loss, unilateral, right ear, with unrestricted hearing on the contralateral side: Secondary | ICD-10-CM | POA: Insufficient documentation

## 2024-05-27 NOTE — Progress Notes (Signed)
 CC: Right ear hearing loss  HPI:  Alexis Ochoa is a 73 y.o. female who presents today for evaluation of her right ear hearing loss.  According to the patient, she first noted the right ear hearing loss 2 years ago.  She has a constant clogging sensation in her right ear.  She was treated with Flonase and Zyrtec without any improvement in her hearing.  She was seen by an ENT physician at that time.  No abnormality was noted.  She has never worn hearing aids.  Currently she denies any otalgia or otorrhea.  Past Medical History:  Diagnosis Date   History of blood transfusion 1970; 04/28/2017   related to childbirth; related to anemia (04/28/2017 - admitted with Pancytopenia - Dx Pernicious Anemia - Vit B12 deficiency)   Hypertension    Hypothyroidism    On levothyroxine    Iron deficiency anemia 04/28/2017   PAF (paroxysmal atrial fibrillation) (HCC) 09/2022   Complicated by sinus bradycardia   Pancytopenia (HCC) 04/28/2017   Pernicious anemia    Situational depression    when husband was real sick; when adopting grandchildren    Past Surgical History:  Procedure Laterality Date   48 HR HOLTER MONITOR  05/2018   Sinus Bradycardia (35 @ ~2 AM & 7 AM) / Sinus Rhythm / Sinus Arrhythmia. Max HR 94 ( Avg 54)PVC x 1. PAC's noted. NO symptoms reported on diary. Short 2-4 beat runs of PACs   ABDOMINAL HYSTERECTOMY  ~ 1991-1992   still have my ovaries   ATRIAL FIBRILLATION ABLATION N/A 05/05/2023   Procedure: ATRIAL FIBRILLATION ABLATION;  Surgeon: Nancey Eulas BRAVO, MD;  [facility]: [facility];  Service: Cardiovascular;  Laterality: N/A;   EXPLORATORY LAPAROTOMY  1980s   before hysterectomy   GRADED EXERCISE TOLERANCE TEST  05/2018   HTN response to exercise. No EKG changes c/w ischemia. 9:00 min - 10.1 METS. Peak HR 136 bpm (8% MPHR of 154 bpm).  BP increased from 192/80 to 210/139 mHg.   LAPAROSCOPIC CHOLECYSTECTOMY  11/2013    Family History  Problem Relation Age of Onset    Hypertension Mother    Heart attack Mother    Stroke Mother    Asthma Father    Breast cancer Sister    Cancer Sister    Hypertension Sister    Hypertension Sister    Cancer Brother    Diabetes Brother    Hypertension Brother     Social History:  reports that she has never smoked. She has never used smokeless tobacco. She reports that she does not drink alcohol and does not use drugs.  Allergies:  Allergies  Allergen Reactions   Hydrochlorothiazide     Other Reaction(s): hyponatremia    Prior to Admission medications   Medication Sig Start Date End Date Taking? Authorizing Provider  Ascorbic Acid (VITAMIN C WITH ROSE HIPS) 500 MG tablet Take 500 mg by mouth daily.   Yes [provider]  azelastine (ASTELIN) 0.1 % nasal spray 2 puffs (1 spray in each nostril) Nasally Twice a day; Duration: 30 days 04/25/24  Yes [provider]  cholecalciferol (VITAMIN D) 1000 UNITS tablet Take 1,000 Units by mouth at bedtime.    Yes [provider]  cyanocobalamin  (VITAMIN B12) 1000 MCG/ML injection Inject 1,000 mcg into the muscle every 30 (thirty) days. 05/06/17  Yes [provider]  desoximetasone (TOPICORT) 0.25 % cream Apply 1 application topically as needed (Lit[place] Scrosis).   Yes [provider]  levothyroxine  (  SYNTHROID ) 88 MCG tablet Take 88 mcg by mouth daily before breakfast. 03/22/22  Yes [provider]  valsartan-hydrochlorothiazide (DIOVAN-HCT) 320-12.5 MG tablet Take 1 tablet by mouth daily. 02/11/22  Yes [provider]    Blood pressure (!) 144/81, pulse 60, height 5' 1 (1.549 m), weight 130 lb (59 kg), SpO2 98%. Exam: General: Communicates without difficulty, well nourished, no acute distress. Head: Normocephalic, no evidence injury, no tenderness, facial buttresses intact without stepoff. Face/sinus: No tenderness to palpation and percussion. Facial movement is normal and symmetric. Eyes: PERRL, EOMI. No scleral  icterus, conjunctivae clear. Neuro: CN II exam reveals vision grossly intact.  No nystagmus at any point of gaze. Ears: Auricles well formed without lesions.  Ear canals are intact without mass or lesion.  No erythema or edema is appreciated.  The TMs are intact without fluid. Nose: External evaluation reveals normal support and skin without lesions.  Dorsum is intact.  Anterior rhinoscopy reveals normal mucosa over anterior aspect of inferior turbinates and intact septum.  No purulence noted. Oral:  Oral cavity and oropharynx are intact, symmetric, without erythema or edema.  Mucosa is moist without lesions. Neck: Full range of motion without pain.  There is no significant lymphadenopathy.  No masses palpable.  Thyroid  bed within normal limits to palpation.  Parotid glands and submandibular glands equal bilaterally without mass.  Trachea is midline. Neuro:  CN 2-12 grossly intact.   Her hearing test shows asymmetric right ear conductive hearing loss.  In addition, she also has bilateral sensorineural hearing loss.  Assessment: 1.  Asymmetric right ear conductive hearing loss.  This is most likely secondary to otosclerosis. 2.  Her ear canals, tympanic membranes, and middle ear spaces are noted to be normal.  Plan: 1.  The physical exam findings and the hearing test results are reviewed with the patient. 2.  The treatment options are discussed.  The options include conservative observation, use of hearing aids, and surgical intervention with middle ear exploration and possible stapedectomy. 3.  The patient is interested in proceeding with conservative observation for now.  She will return for reevaluation in 1 year with repeat hearing test.  Alexis Ochoa W Chimaobi Casebolt 05/27/2024, 5:40 PM

## 2024-05-30 ENCOUNTER — Ambulatory Visit: Payer: Self-pay | Admitting: Cardiovascular Disease

## 2024-06-04 DIAGNOSIS — I10 Essential (primary) hypertension: Secondary | ICD-10-CM | POA: Diagnosis not present

## 2024-06-04 DIAGNOSIS — I480 Paroxysmal atrial fibrillation: Secondary | ICD-10-CM | POA: Diagnosis not present

## 2024-06-06 ENCOUNTER — Encounter: Payer: Self-pay | Admitting: Audiology

## 2024-06-18 DIAGNOSIS — F3340 Major depressive disorder, recurrent, in remission, unspecified: Secondary | ICD-10-CM | POA: Diagnosis not present

## 2024-06-18 DIAGNOSIS — E039 Hypothyroidism, unspecified: Secondary | ICD-10-CM | POA: Diagnosis not present

## 2024-06-18 DIAGNOSIS — M810 Age-related osteoporosis without current pathological fracture: Secondary | ICD-10-CM | POA: Diagnosis not present

## 2024-06-18 DIAGNOSIS — I10 Essential (primary) hypertension: Secondary | ICD-10-CM | POA: Diagnosis not present

## 2024-06-18 DIAGNOSIS — I480 Paroxysmal atrial fibrillation: Secondary | ICD-10-CM | POA: Diagnosis not present

## 2024-06-20 DIAGNOSIS — E538 Deficiency of other specified B group vitamins: Secondary | ICD-10-CM | POA: Diagnosis not present

## 2024-06-26 ENCOUNTER — Ambulatory Visit (INDEPENDENT_AMBULATORY_CARE_PROVIDER_SITE_OTHER): Payer: Self-pay

## 2024-06-26 DIAGNOSIS — R001 Bradycardia, unspecified: Secondary | ICD-10-CM

## 2024-06-26 LAB — CUP PACEART REMOTE DEVICE CHECK
Date Time Interrogation Session: 20250906231721
Implantable Pulse Generator Implant Date: 20240412

## 2024-07-04 DIAGNOSIS — I480 Paroxysmal atrial fibrillation: Secondary | ICD-10-CM | POA: Diagnosis not present

## 2024-07-04 DIAGNOSIS — I10 Essential (primary) hypertension: Secondary | ICD-10-CM | POA: Diagnosis not present

## 2024-07-06 ENCOUNTER — Ambulatory Visit: Payer: Self-pay | Admitting: Cardiovascular Disease

## 2024-07-06 NOTE — Progress Notes (Signed)
 Remote Loop Recorder Transmission

## 2024-07-14 DIAGNOSIS — L989 Disorder of the skin and subcutaneous tissue, unspecified: Secondary | ICD-10-CM | POA: Diagnosis not present

## 2024-07-15 NOTE — Progress Notes (Signed)
 Remote Loop Recorder Transmission

## 2024-07-18 DIAGNOSIS — C44622 Squamous cell carcinoma of skin of right upper limb, including shoulder: Secondary | ICD-10-CM | POA: Diagnosis not present

## 2024-07-18 DIAGNOSIS — I480 Paroxysmal atrial fibrillation: Secondary | ICD-10-CM | POA: Diagnosis not present

## 2024-07-18 DIAGNOSIS — M810 Age-related osteoporosis without current pathological fracture: Secondary | ICD-10-CM | POA: Diagnosis not present

## 2024-07-18 DIAGNOSIS — I10 Essential (primary) hypertension: Secondary | ICD-10-CM | POA: Diagnosis not present

## 2024-07-18 DIAGNOSIS — L570 Actinic keratosis: Secondary | ICD-10-CM | POA: Diagnosis not present

## 2024-07-18 DIAGNOSIS — Z85828 Personal history of other malignant neoplasm of skin: Secondary | ICD-10-CM | POA: Diagnosis not present

## 2024-07-18 DIAGNOSIS — E039 Hypothyroidism, unspecified: Secondary | ICD-10-CM | POA: Diagnosis not present

## 2024-07-18 DIAGNOSIS — F3340 Major depressive disorder, recurrent, in remission, unspecified: Secondary | ICD-10-CM | POA: Diagnosis not present

## 2024-07-19 DIAGNOSIS — E538 Deficiency of other specified B group vitamins: Secondary | ICD-10-CM | POA: Diagnosis not present

## 2024-07-27 ENCOUNTER — Ambulatory Visit (INDEPENDENT_AMBULATORY_CARE_PROVIDER_SITE_OTHER): Payer: Self-pay

## 2024-07-27 DIAGNOSIS — R001 Bradycardia, unspecified: Secondary | ICD-10-CM

## 2024-07-28 LAB — CUP PACEART REMOTE DEVICE CHECK
Date Time Interrogation Session: 20251008232847
Implantable Pulse Generator Implant Date: 20240412

## 2024-07-28 NOTE — Progress Notes (Signed)
 Remote Loop Recorder Transmission

## 2024-07-31 NOTE — Progress Notes (Signed)
 Remote Loop Recorder Transmission

## 2024-08-03 ENCOUNTER — Ambulatory Visit: Payer: Self-pay | Admitting: Cardiovascular Disease

## 2024-08-03 DIAGNOSIS — I10 Essential (primary) hypertension: Secondary | ICD-10-CM | POA: Diagnosis not present

## 2024-08-03 DIAGNOSIS — I480 Paroxysmal atrial fibrillation: Secondary | ICD-10-CM | POA: Diagnosis not present

## 2024-08-09 ENCOUNTER — Encounter: Payer: Self-pay | Admitting: Cardiology

## 2024-08-09 ENCOUNTER — Ambulatory Visit: Attending: Cardiology | Admitting: Cardiology

## 2024-08-09 VITALS — BP 126/66 | HR 52 | Ht 61.0 in | Wt 135.0 lb

## 2024-08-09 DIAGNOSIS — I480 Paroxysmal atrial fibrillation: Secondary | ICD-10-CM | POA: Diagnosis not present

## 2024-08-09 DIAGNOSIS — R55 Syncope and collapse: Secondary | ICD-10-CM

## 2024-08-09 DIAGNOSIS — I10 Essential (primary) hypertension: Secondary | ICD-10-CM

## 2024-08-09 DIAGNOSIS — D6869 Other thrombophilia: Secondary | ICD-10-CM | POA: Diagnosis not present

## 2024-08-09 DIAGNOSIS — R001 Bradycardia, unspecified: Secondary | ICD-10-CM | POA: Diagnosis not present

## 2024-08-09 NOTE — Patient Instructions (Signed)
 Medication Instructions:   No changes  *If you need a refill on your cardiac medications before your next appointment, please call your pharmacy*   Lab Work:  not needed If you have labs (blood work) drawn today and your tests are completely normal, you will receive your results only by: MyChart Message (if you have MyChart) OR A paper copy in the mail If you have any lab test that is abnormal or we need to change your treatment, we will call you to review the results.   Testing/Procedures: Not needed   Follow-Up: At [facility], you and your health needs are our priority.  As part of our continuing mission to provide you with exceptional heart care, we have created designated Provider Care Teams.  These Care Teams include your primary Cardiologist (physician) and Advanced Practice Providers (APPs -  Physician Assistants and Nurse Practitioners) who all work together to provide you with the care you need, when you need it.     Your next appointment:   12 month(s)  The format for your next appointment:   In Person  Provider:   Damien Braver, NP or Katlyn West, NP      Then, Alm Clay, MD will plan to see you again in 24 month(s).

## 2024-08-09 NOTE — Progress Notes (Signed)
 Cardiology Office Note:  .   Date:  08/15/2024  ID:  Alexis Ochoa, DOB 10-24-1950, MRN [id number] PCP: Teresa Channel, MD  [facility] Providers Cardiologist:  Alm Clay, MD Electrophysiologist:  Eulas FORBES Furbish, MD     Chief Complaint  Patient presents with   Follow-up    I sensitively delayed primary cardiology follow-up last seen by Dr. Furbish from EP in June 2025   Atrial Fibrillation    Status post ablation.   Hypertension    Readings are better at home than here.    Patient Profile: .     Alexis Ochoa is a  73 y.o. female  with a PMH notable for PAF (s/p ablation), bradycardia, hypertension and history of syncope who presents here for delayed annual follow-up at the request of Teresa Channel, MD.  PMH: PAF with ~1% burden => S/P PVI/Ablation June 2024 Bradycardia-in the setting of PACs and bigeminy. S/p ILR 05/31/2023-no breakthrough spells of A-fib Hypothyroidism Hypertension History of syncope: Thought to be orthostatic/vasovagal.     I last saw Alexis Ochoa in June 2024: Deferred to Dr. Furbish as to whether or not she gets pacemaker placed.  ILR in place.  She was most recently seen by Dr. Furbish on 04/10/2024 she described again her episode of syncope back in December when she just got over a GI illness and the syncope thought to be related to dehydration.  Noted that her A-fib rates get really fast up to 220 bpm range she is feels quite dyspneic with this.  However in sinus rhythm she notes slow heart rates and may be tacky bradycardia.  Subjective  Discussed the use of AI scribe software for clinical note transcription with the patient, who gave verbal consent to proceed.  History of Present Illness Alexis Ochoa is a 73 year old female with paroxysmal atrial fibrillation and hypertension who presents for follow-up after AFib ablation.  She has a history of paroxysmal atrial fibrillation with episodes of rapid heart rates reaching up to  220 beats per minute, causing significant dyspnea. She underwent an AFib ablation in July 2024 and had a loop recorder placed. Since the procedure, she has not experienced any recurrent episodes of AFib. She was previously on Eliquis , which has been discontinued following the ablation --discontinued by Dr. Furbish.  in December, she experienced an episode of syncope attributed to dehydration following a gastrointestinal illness. There have been no further episodes of syncope since then.  Her blood pressure is monitored daily at home, typically ranging from low 130s to 110s. She is on valsartan HCTZ for hypertension.  No current symptoms of shortness of breath, chest pain, or palpitations. She also reports no dizziness, wooziness, or shortness of breath when lying flat.  She maintains an active lifestyle, working as a radio broadcast assistant in church activities. She has not experienced any significant weight changes recently.     Objective   Pertinent Medications: Valsartan-HCTZ 320-12.5 mg daily. Synthroid  112 mcg daily   Studies Reviewed: SABRA   EKG Interpretation Date/Time:  Wednesday August 09 2024 10:16:54 EDT Ventricular Rate:  52 PR Interval:  156 QRS Duration:  114 QT Interval:  470 QTC Calculation: 437 R Axis:   -45  Text Interpretation: Sinus bradycardia with sinus arrhythmia Pulmonary disease pattern Left anterior fascicular block Minimal voltage criteria for LVH, may be normal variant ( Cornell product ) Septal infarct , age undetermined When compared with ECG of 10-Apr-2024 10:08, Premature ventricular complexes NO LONGER  PRESENT Confirmed by Anner Lenis ([id number]) on 08/09/2024 10:41:37 AM    She underwent A-fib ablation July 2024 and then had a loop recorder placed and had no A-fib ablation and start recurrence since that ablation.  Zio patch 14d 10/24/2022 Sinus rhythm HR 37-132, average 55 bpm Multiple atrial high rate episodes appear to be AF AF burden 1%. No  pauses reported Frequent Atrial ectopy, 7.4%  Risk Assessment/Calculations:    Afib s/p Ablation -- NO LONGER ON DOAC CHA2DS2-VASc Score = 3   This indicates a 3.2% annual risk of stroke. The patient's score is based upon: CHF History: 0 HTN History: 1 Diabetes History: 0 Stroke History: 0 Vascular Disease History: 0 Age Score: 1 Gender Score: 1         Physical Exam:   VS:  BP 126/66   Pulse (!) 52   Ht 5' 1 (1.549 m)   Wt 135 lb (61.2 kg)   SpO2 96%   BMI 25.51 kg/m    Wt Readings from Last 3 Encounters:  08/09/24 135 lb (61.2 kg)  05/25/24 130 lb (59 kg)  04/10/24 133 lb 3.2 oz (60.4 kg)     GEN: Healthy appearing; well nourished, well groomed; in no acute distress NECK: No JVD; No carotid bruits CARDIAC: Bradycardic with ectopy; normal S1, S2; no murmurs, rubs, gallops RESPIRATORY:  Clear to auscultation without rales, wheezing or rhonchi ; nonlabored, good air movement. ABDOMEN: Soft, non-tender, non-distended EXTREMITIES:  No edema; No deformity     ASSESSMENT AND PLAN: .    Problem List Items Addressed This Visit       Cardiology Problems   Essential hypertension, benign (Chronic)   Blood pressure slightly elevated in office on first evaluation, but well-controlled at home with valsartan HCTZ. Home readings range from 110/30 to 120/66. - Continue valsartan HCTZ for blood pressure management.  - Monitor blood pressure at home regularly. Low threshold to consider nondihydropyridine calcium channel blocker.      Relevant Orders   EKG 12-Lead (Completed)   Hypercoagulable state due to paroxysmal atrial fibrillation (HCC): CHA2DS2-VASc Score 4 (age-28, HTN, female, aortic calcification) (Chronic)   Because of bleeding issues, and no recurrent A-fib noted on ILR, Dr. Nancey decided to go ahead and stop Eliquis .      Paroxysmal atrial fibrillation (HCC) - Primary (Chronic)   Status post AFib ablation with no recurrent episodes as confirmed by loop  recorder.  No symptoms of dyspnea, chest pain, or syncope. No AV nodal agents because of baseline bradycardia.   No signs or symptoms of chronotropic incompetence. - Continue monitoring with loop recorder for AFib recurrence and bradycardia.      Sinus bradycardia (Chronic)   Bradycardia previously noted but currently asymptomatic. Loop recorder shows no concerning episodes. Heart rate is 52 bpm, asymptomatic during activities. - Continue monitoring heart rate with loop recorder      Relevant Orders   EKG 12-Lead (Completed)     Other   Syncope and collapse (Chronic)   Has been almost 2+ years since the episode.  Try to maintain adequate hydration.  Allow for mild permissive hypertension.      Relevant Orders   EKG 12-Lead (Completed)           Follow-Up: No follow-ups on file.     Signed, Lenis MICAEL Anner, MD, MS Lenis Anner, M.D., M.S. Interventional Cardiologist  [facility] Pager # [phone]

## 2024-08-15 ENCOUNTER — Encounter: Payer: Self-pay | Admitting: Cardiology

## 2024-08-15 NOTE — Assessment & Plan Note (Signed)
 Blood pressure slightly elevated in office on first evaluation, but well-controlled at home with valsartan HCTZ. Home readings range from 110/30 to 120/66. - Continue valsartan HCTZ for blood pressure management.  - Monitor blood pressure at home regularly. Low threshold to consider nondihydropyridine calcium channel blocker.

## 2024-08-15 NOTE — Assessment & Plan Note (Signed)
 Because of bleeding issues, and no recurrent A-fib noted on ILR, Dr. Nancey decided to go ahead and stop Eliquis .

## 2024-08-15 NOTE — Assessment & Plan Note (Addendum)
 Bradycardia previously noted but currently asymptomatic. Loop recorder shows no concerning episodes. Heart rate is 52 bpm, asymptomatic during activities. - Continue monitoring heart rate with loop recorder

## 2024-08-15 NOTE — Assessment & Plan Note (Signed)
 Has been almost 2+ years since the episode.  Try to maintain adequate hydration.  Allow for mild permissive hypertension.

## 2024-08-15 NOTE — Assessment & Plan Note (Signed)
 Status post AFib ablation with no recurrent episodes as confirmed by loop recorder.  No symptoms of dyspnea, chest pain, or syncope. No AV nodal agents because of baseline bradycardia.   No signs or symptoms of chronotropic incompetence. - Continue monitoring with loop recorder for AFib recurrence and bradycardia.

## 2024-08-18 DIAGNOSIS — E039 Hypothyroidism, unspecified: Secondary | ICD-10-CM | POA: Diagnosis not present

## 2024-08-18 DIAGNOSIS — I10 Essential (primary) hypertension: Secondary | ICD-10-CM | POA: Diagnosis not present

## 2024-08-18 DIAGNOSIS — M810 Age-related osteoporosis without current pathological fracture: Secondary | ICD-10-CM | POA: Diagnosis not present

## 2024-08-18 DIAGNOSIS — F3340 Major depressive disorder, recurrent, in remission, unspecified: Secondary | ICD-10-CM | POA: Diagnosis not present

## 2024-08-18 DIAGNOSIS — I480 Paroxysmal atrial fibrillation: Secondary | ICD-10-CM | POA: Diagnosis not present

## 2024-08-22 DIAGNOSIS — E538 Deficiency of other specified B group vitamins: Secondary | ICD-10-CM | POA: Diagnosis not present

## 2024-08-27 ENCOUNTER — Ambulatory Visit (INDEPENDENT_AMBULATORY_CARE_PROVIDER_SITE_OTHER): Payer: Self-pay

## 2024-08-27 DIAGNOSIS — I480 Paroxysmal atrial fibrillation: Secondary | ICD-10-CM

## 2024-08-27 LAB — CUP PACEART REMOTE DEVICE CHECK
Date Time Interrogation Session: 20251108232232
Implantable Pulse Generator Implant Date: 20240412

## 2024-08-30 ENCOUNTER — Ambulatory Visit: Payer: Self-pay | Admitting: Cardiovascular Disease

## 2024-08-31 NOTE — Progress Notes (Signed)
 Remote Loop Recorder Transmission

## 2024-09-02 DIAGNOSIS — I480 Paroxysmal atrial fibrillation: Secondary | ICD-10-CM | POA: Diagnosis not present

## 2024-09-02 DIAGNOSIS — I10 Essential (primary) hypertension: Secondary | ICD-10-CM | POA: Diagnosis not present

## 2024-09-17 DIAGNOSIS — F3340 Major depressive disorder, recurrent, in remission, unspecified: Secondary | ICD-10-CM | POA: Diagnosis not present

## 2024-09-17 DIAGNOSIS — I10 Essential (primary) hypertension: Secondary | ICD-10-CM | POA: Diagnosis not present

## 2024-09-17 DIAGNOSIS — E039 Hypothyroidism, unspecified: Secondary | ICD-10-CM | POA: Diagnosis not present

## 2024-09-17 DIAGNOSIS — I480 Paroxysmal atrial fibrillation: Secondary | ICD-10-CM | POA: Diagnosis not present

## 2024-09-17 DIAGNOSIS — M810 Age-related osteoporosis without current pathological fracture: Secondary | ICD-10-CM | POA: Diagnosis not present

## 2024-09-20 DIAGNOSIS — E538 Deficiency of other specified B group vitamins: Secondary | ICD-10-CM | POA: Diagnosis not present

## 2024-09-29 ENCOUNTER — Ambulatory Visit: Attending: Cardiovascular Disease

## 2024-09-29 DIAGNOSIS — I480 Paroxysmal atrial fibrillation: Secondary | ICD-10-CM

## 2024-09-30 LAB — CUP PACEART REMOTE DEVICE CHECK
Date Time Interrogation Session: 20251211231626
Implantable Pulse Generator Implant Date: 20240412

## 2024-10-06 NOTE — Progress Notes (Signed)
 Remote Loop Recorder Transmission

## 2024-10-16 ENCOUNTER — Ambulatory Visit: Payer: Self-pay | Admitting: Cardiovascular Disease

## 2024-10-30 ENCOUNTER — Ambulatory Visit: Attending: Cardiovascular Disease

## 2024-10-30 DIAGNOSIS — I480 Paroxysmal atrial fibrillation: Secondary | ICD-10-CM | POA: Diagnosis not present

## 2024-10-30 LAB — CUP PACEART REMOTE DEVICE CHECK
Date Time Interrogation Session: 20260111232551
Implantable Pulse Generator Implant Date: 20240412

## 2024-11-03 ENCOUNTER — Ambulatory Visit: Payer: Self-pay | Admitting: Cardiovascular Disease

## 2024-11-03 NOTE — Progress Notes (Signed)
 Remote Loop Recorder Transmission

## 2024-11-30 ENCOUNTER — Ambulatory Visit

## 2024-12-31 ENCOUNTER — Ambulatory Visit

## 2025-01-31 ENCOUNTER — Ambulatory Visit
